# Patient Record
Sex: Female | Born: 1990 | Race: Black or African American | Hispanic: No | Marital: Single | Smoking: Former smoker
Health system: Southern US, Community
[De-identification: ages and names within clinical notes are randomized; demographics above are authoritative.]

## PROBLEM LIST (undated history)

## (undated) DIAGNOSIS — F410 Panic disorder [episodic paroxysmal anxiety] without agoraphobia: Secondary | ICD-10-CM

## (undated) DIAGNOSIS — F419 Anxiety disorder, unspecified: Secondary | ICD-10-CM

---
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
## 2009-03-09 ENCOUNTER — Emergency Department (HOSPITAL_COMMUNITY): Admission: EM | Admit: 2009-03-09 | Discharge: 2009-03-09 | Payer: Self-pay | Admitting: Family Medicine

## 2010-03-22 ENCOUNTER — Emergency Department (HOSPITAL_COMMUNITY)
Admission: EM | Admit: 2010-03-22 | Discharge: 2010-03-22 | Payer: Self-pay | Source: Home / Self Care | Admitting: Family Medicine

## 2010-05-31 LAB — HIV ANTIBODY (ROUTINE TESTING W REFLEX): HIV: NONREACTIVE

## 2010-05-31 LAB — POCT URINALYSIS DIPSTICK
Bilirubin Urine: NEGATIVE
Glucose, UA: NEGATIVE mg/dL
Hgb urine dipstick: NEGATIVE
Ketones, ur: NEGATIVE mg/dL
Nitrite: NEGATIVE
Protein, ur: NEGATIVE mg/dL
Specific Gravity, Urine: 1.015 (ref 1.005–1.030)
Urobilinogen, UA: 0.2 mg/dL (ref 0.0–1.0)
pH: 7.5 (ref 5.0–8.0)

## 2010-05-31 LAB — POCT PREGNANCY, URINE: Preg Test, Ur: NEGATIVE

## 2010-05-31 LAB — GC/CHLAMYDIA PROBE AMP, GENITAL
Chlamydia, DNA Probe: NEGATIVE
GC Probe Amp, Genital: NEGATIVE

## 2010-05-31 LAB — WET PREP, GENITAL
Trich, Wet Prep: NONE SEEN
Yeast Wet Prep HPF POC: NONE SEEN

## 2010-05-31 LAB — HSV 2 ANTIBODY, IGG: HSV 2 Glycoprotein G Ab, IgG: 0.11 IV

## 2010-05-31 LAB — RPR: RPR Ser Ql: NONREACTIVE

## 2010-06-21 LAB — POCT URINALYSIS DIP (DEVICE)
Bilirubin Urine: NEGATIVE
Glucose, UA: NEGATIVE mg/dL
Nitrite: POSITIVE — AB
Protein, ur: 30 mg/dL — AB
Specific Gravity, Urine: 1.02 (ref 1.005–1.030)
Urobilinogen, UA: 1 mg/dL (ref 0.0–1.0)
pH: 6 (ref 5.0–8.0)

## 2010-06-21 LAB — POCT PREGNANCY, URINE: Preg Test, Ur: NEGATIVE

## 2010-06-24 LAB — GC/CHLAMYDIA PROBE AMP, GENITAL
Chlamydia, DNA Probe: NEGATIVE
GC Probe Amp, Genital: NEGATIVE

## 2010-06-24 LAB — POCT URINALYSIS DIP (DEVICE)
Bilirubin Urine: NEGATIVE
Glucose, UA: NEGATIVE mg/dL
Hgb urine dipstick: NEGATIVE
Ketones, ur: NEGATIVE mg/dL
Nitrite: NEGATIVE
Protein, ur: NEGATIVE mg/dL
Specific Gravity, Urine: 1.01 (ref 1.005–1.030)
Urobilinogen, UA: 0.2 mg/dL (ref 0.0–1.0)
pH: 5.5 (ref 5.0–8.0)

## 2010-06-24 LAB — WET PREP, GENITAL
Clue Cells Wet Prep HPF POC: NONE SEEN
Yeast Wet Prep HPF POC: NONE SEEN

## 2010-06-24 LAB — POCT PREGNANCY, URINE: Preg Test, Ur: NEGATIVE

## 2010-06-27 LAB — WET PREP, GENITAL
Clue Cells Wet Prep HPF POC: NONE SEEN
Yeast Wet Prep HPF POC: NONE SEEN

## 2010-06-27 LAB — POCT URINALYSIS DIP (DEVICE)
Glucose, UA: NEGATIVE mg/dL
Ketones, ur: NEGATIVE mg/dL
Nitrite: POSITIVE — AB
Protein, ur: 100 mg/dL — AB
Specific Gravity, Urine: >=1.03 (ref 1.005–1.030)
Urobilinogen, UA: 1 mg/dL (ref 0.0–1.0)
pH: 5.5 (ref 5.0–8.0)

## 2010-06-27 LAB — RPR: RPR Ser Ql: NONREACTIVE

## 2010-06-27 LAB — URINE CULTURE: Colony Count: >=100000

## 2010-06-27 LAB — HIV ANTIBODY (ROUTINE TESTING W REFLEX): HIV: NONREACTIVE

## 2010-06-27 LAB — POCT RAPID STREP A (OFFICE): Streptococcus, Group A Screen (Direct): NEGATIVE

## 2010-06-27 LAB — POCT PREGNANCY, URINE: Preg Test, Ur: NEGATIVE

## 2010-06-27 LAB — GC/CHLAMYDIA PROBE AMP, GENITAL
Chlamydia, DNA Probe: NEGATIVE
GC Probe Amp, Genital: NEGATIVE

## 2010-09-28 ENCOUNTER — Inpatient Hospital Stay (INDEPENDENT_AMBULATORY_CARE_PROVIDER_SITE_OTHER)
Admission: RE | Admit: 2010-09-28 | Discharge: 2010-09-28 | Disposition: A | Discharge status: Home / Self Care | Payer: Self-pay | Source: Ambulatory Visit | Attending: Family Medicine | Admitting: Family Medicine

## 2010-09-28 DIAGNOSIS — J020 Streptococcal pharyngitis: Secondary | ICD-10-CM

## 2010-09-28 LAB — POCT RAPID STREP A: Streptococcus, Group A Screen (Direct): NEGATIVE

## 2012-10-23 ENCOUNTER — Emergency Department (INDEPENDENT_AMBULATORY_CARE_PROVIDER_SITE_OTHER)
Admission: EM | Admit: 2012-10-23 | Discharge: 2012-10-23 | Disposition: A | Discharge status: Home / Self Care | Payer: Self-pay | Source: Home / Self Care

## 2012-10-23 ENCOUNTER — Encounter (HOSPITAL_COMMUNITY): Payer: Self-pay | Admitting: Emergency Medicine

## 2012-10-23 ENCOUNTER — Other Ambulatory Visit (HOSPITAL_COMMUNITY)
Admission: RE | Admit: 2012-10-23 | Discharge: 2012-10-23 | Disposition: A | Discharge status: Home / Self Care | Payer: Self-pay | Source: Ambulatory Visit | Attending: Family Medicine | Admitting: Family Medicine

## 2012-10-23 DIAGNOSIS — Z113 Encounter for screening for infections with a predominantly sexual mode of transmission: Secondary | ICD-10-CM | POA: Insufficient documentation

## 2012-10-23 DIAGNOSIS — N760 Acute vaginitis: Secondary | ICD-10-CM | POA: Insufficient documentation

## 2012-10-23 DIAGNOSIS — N898 Other specified noninflammatory disorders of vagina: Secondary | ICD-10-CM

## 2012-10-23 MED ORDER — METRONIDAZOLE 500 MG PO TABS: 500.0000 mg | ORAL_TABLET | Freq: Two times a day (BID) | ORAL | Status: DC

## 2012-10-23 NOTE — ED Notes (Signed)
C/o vaginal discharge.  Patient states she has been treatment before for BV and it keeps coming back.  White greyish color discharge.  Denies urinating problems

## 2012-10-23 NOTE — ED Provider Notes (Signed)
Angelica Tyler is a 22 y.o. female who presents to Urgent Care today for vaginal discharge present for the last several days consistent with prior episodes of BV. Patient denies any dysuria abdominal pain nausea vomiting or diarrhea and feels well otherwise. No fevers or chills.    PMH reviewed. History of prior BV History  Substance Use Topics  . Smoking status: Not on file  . Smokeless tobacco: Not on file  . Alcohol Use: Not on file   ROS as above Medications reviewed. No current facility-administered medications for this encounter.   Current Outpatient Prescriptions  Medication Sig Dispense Refill  . metroNIDAZOLE (FLAGYL) 500 MG tablet Take 1 tablet (500 mg total) by mouth 2 (two) times daily.  14 tablet  0    Exam:  BP 135/90  Pulse 98  Temp(Src) 99.2 F (37.3 C)  Resp 15  SpO2 100%  LMP 09/13/2012 Gen: Well NAD HEENT: EOMI,  MMM Lungs: CTABL Nl WOB Heart: RRR no MRG Abd: NABS, NT, ND Exts: Non edematous BL  LE, warm and well perfused.  GYN: Normal external genitalia. Vaginal canal with thin grayish discharge. Normal-appearing cervix.  No results found for this or any previous visit (from the past 24 hour(s)). No results found.  Assessment and Plan: 22 y.o. female with vaginal discharge consistent with BV.  Vaginal secretions cytology for gonorrhea, Chlamydia, trichomonas, Gardnerella and yeast.  Treat empirically for BV with metronidazole.  Call patient with result.  Discussed warning signs or symptoms. Please see discharge instructions. Patient expresses understanding.      Rodolph Bong, MD 10/23/12 [phone]

## 2012-10-25 NOTE — ED Notes (Signed)
GC/Chlamydia neg., Affirm: Candida and Trich neg., Gardnerella pos.  Pt. adequately treated with Flagyl. Vassie Moselle 10/25/2012

## 2014-08-05 ENCOUNTER — Emergency Department (INDEPENDENT_AMBULATORY_CARE_PROVIDER_SITE_OTHER)
Admission: EM | Admit: 2014-08-05 | Discharge: 2014-08-05 | Disposition: A | Discharge status: Home / Self Care | Payer: Self-pay | Source: Home / Self Care | Attending: Family Medicine | Admitting: Family Medicine

## 2014-08-05 ENCOUNTER — Encounter (HOSPITAL_COMMUNITY): Payer: Self-pay | Admitting: *Deleted

## 2014-08-05 DIAGNOSIS — F430 Acute stress reaction: Principal | ICD-10-CM

## 2014-08-05 DIAGNOSIS — F410 Panic disorder [episodic paroxysmal anxiety] without agoraphobia: Secondary | ICD-10-CM

## 2014-08-05 NOTE — Discharge Instructions (Signed)
Call [facility] or go to [place] [facility] for further psychological assistance.

## 2014-08-05 NOTE — ED Notes (Signed)
People at work think she is having s panic attacks because she is having dizziness, hands, feet roof of mouth and tongue tingling, shaky, and hard to breathe  sometimes.  Sx.'s last  1 hr., twice a week, since last Wed.  Work told her she needs a work release to come back.

## 2014-08-05 NOTE — ED Provider Notes (Signed)
CSN: [id number]     Arrival date & time 08/05/14  1250 History   First MD Initiated Contact with Patient 08/05/14 1457     Chief Complaint  Patient presents with  . Panic Attack   (Consider location/radiation/quality/duration/timing/severity/associated sxs/prior Treatment) Patient is a 24 y.o. female presenting with mental health disorder. The history is provided by the patient and a parent.  Mental Health Problem Presenting symptoms comment:  No sx at present, feels fine. Patient accompanied by:  Family member Degree of incapacity (severity):  Mild Onset quality:  Gradual Duration:  1 week Progression:  Unchanged Chronicity:  New Context: stressful life event   Context comment:  Just started a new production job at [employer] and has been having episodes c/w panic. Relieved by:  None tried Ineffective treatments:  None tried Associated symptoms: anxiety     History reviewed. No pertinent past medical history. History reviewed. No pertinent past surgical history. History reviewed. No pertinent family history. History  Substance Use Topics  . Smoking status: Current Every Day Smoker  . Smokeless tobacco: Not on file  . Alcohol Use: No   OB History    No data available     Review of Systems  Constitutional: Negative.   HENT: Negative.   Respiratory: Negative.   Cardiovascular: Negative.   Gastrointestinal: Negative.   Neurological: Positive for dizziness, light-headedness and numbness. Negative for tremors and weakness.  Psychiatric/Behavioral: The patient is nervous/anxious.     Allergies  Review of patient's allergies indicates no known allergies.  Home Medications   Prior to Admission medications   Medication Sig Start Date End Date Taking? Authorizing Provider  metroNIDAZOLE (FLAGYL) 500 MG tablet Take 1 tablet (500 mg total) by mouth 2 (two) times daily. 10/23/12   Rodolph BongEvan S Corey, MD   LMP 07/11/2014 Physical Exam  Constitutional: She is oriented to person, place,  and time. She appears well-developed and well-nourished. No distress.  Neck: Normal range of motion. Neck supple.  Cardiovascular: Normal rate, regular rhythm, normal heart sounds and intact distal pulses.   Pulmonary/Chest: Effort normal and breath sounds normal.  Neurological: She is alert and oriented to person, place, and time.  Skin: Skin is warm and dry.  Psychiatric: She has a normal mood and affect. Her behavior is normal. Judgment and thought content normal.  Nursing note and vitals reviewed.   ED Course  Procedures (including critical care time) Labs Review Labs Reviewed - No data to display  Imaging Review No results found.   MDM   1. Panic attack as reaction to stress        Linna HoffJames D Bobbette Eakes, MD 08/05/14 [phone]

## 2014-08-08 ENCOUNTER — Encounter: Payer: Self-pay | Admitting: Emergency Medicine

## 2014-08-08 ENCOUNTER — Emergency Department
Admission: EM | Admit: 2014-08-08 | Discharge: 2014-08-08 | Disposition: A | Discharge status: Home / Self Care | Payer: Self-pay | Attending: Emergency Medicine | Admitting: Emergency Medicine

## 2014-08-08 DIAGNOSIS — F410 Panic disorder [episodic paroxysmal anxiety] without agoraphobia: Secondary | ICD-10-CM

## 2014-08-08 DIAGNOSIS — Z720 Tobacco use: Secondary | ICD-10-CM | POA: Insufficient documentation

## 2014-08-08 DIAGNOSIS — Z79899 Other long term (current) drug therapy: Secondary | ICD-10-CM | POA: Insufficient documentation

## 2014-08-08 DIAGNOSIS — F419 Anxiety disorder, unspecified: Secondary | ICD-10-CM | POA: Insufficient documentation

## 2014-08-08 HISTORY — DX: Anxiety disorder, unspecified: F41.9

## 2014-08-08 HISTORY — DX: Panic disorder (episodic paroxysmal anxiety): F41.0

## 2014-08-08 NOTE — ED Notes (Signed)
Patient states she needs work release note for panic attacks. Informed patient that ER will not give work release notes outside of work note today.

## 2014-08-08 NOTE — ED Provider Notes (Signed)
[facility] Emergency Department Provider Note  ____________________________________________  Time seen: 1211  I have reviewed the triage vital signs and the nursing notes.   HISTORY  Chief Complaint Panic Attack  HPI Damian Leavellyawni M Garczynski is a 24 y.o. female is here today to be cleared for panic attacks and she can go back to work. She was seen in [place] on 5/17 in the emergency room for a panic attack.She was referred from coming to [facility] but mother states that they did not follow up there and patient states she did not get the prescription filled. She denies having any problems now. 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 They will not allow her to come back to work until she has been cleared for panic attacks. She denies any pain at this time.   Past Medical History  Diagnosis Date  . Panic attack   . Anxiety     There are no active problems to display for this patient.   No past surgical history on file.  Current Outpatient Rx  Name  Route  Sig  Dispense  Refill  . metroNIDAZOLE (FLAGYL) 500 MG tablet   Oral   Take 1 tablet (500 mg total) by mouth 2 (two) times daily.   14 tablet   0     Allergies Review of patient's allergies indicates no known allergies.  No family history on file.  Social History History  Substance Use Topics  . Smoking status: Current Every Day Smoker -- 0.00 packs/day    Types: Cigarettes  . Smokeless tobacco: Not on file  . Alcohol Use: No    Review of Systems Constitutional: No fever/chills Eyes: No visual changes. Cardiovascular: Denies chest pain. Respiratory: Denies shortness of breath. Gastrointestinal: No abdominal pain.  No nausea, no vomiting.  No diarrhea.  No constipation. Genitourinary: Negative for dysuria. Musculoskeletal: Negative for back pain. Skin: Negative for rash. Neurological: Negative for headaches, focal weakness or numbness.  10-point ROS  otherwise negative.  ____________________________________________   PHYSICAL EXAM:  VITAL SIGNS: ED Triage Vitals  Enc Vitals Group     BP 08/08/14 1148 139/84 mmHg     Pulse Rate 08/08/14 1148 87     Resp 08/08/14 1148 18     Temp 08/08/14 1148 98.7 F (37.1 C)     Temp Source 08/08/14 1148 Oral     SpO2 08/08/14 1148 100 %     Weight 08/08/14 1148 178 lb (80.74 kg)     Height 08/08/14 1148 5\' 5"  (1.651 m)     Head Cir --      Peak Flow --      Pain Score 08/08/14 1148 0     Pain Loc --      Pain Edu? --      Excl. in GC? --     Constitutional: Alert and oriented. Well appearing and in no acute distress. Eyes: Conjunctivae are normal. PERRL. EOMI. Head: Atraumatic. Nose: No congestion/rhinnorhea. Mouth/Throat: Mucous membranes are moist.  Neck: No stridor.   Cardiovascular: Normal rate, regular rhythm. Grossly normal heart sounds.  Good peripheral circulation. Respiratory: Normal respiratory effort.  No retractions. Lungs CTAB. Gastrointestinal: Soft and nontender. No distention. Musculoskeletal: No lower extremity tenderness nor edema.  No joint effusions. Neurologic:  Normal speech and language. No gross focal neurologic deficits are appreciated. Speech is normal. No gait instability. Skin:  Skin is warm, dry and intact. No rash noted. Psychiatric: Mood and affect are  normal. Speech and behavior are normal.  ____________________________________________   LABS (all labs ordered are listed, but only abnormal results are displayed)  Labs Reviewed - No data to display ____________________________________________  EKG  Deferred ____________________________________________  RADIOLOGY  Deferred ____________________________________________   PROCEDURES  Procedure(s) performed: None  Critical Care performed: No  ____________________________________________   INITIAL IMPRESSION / ASSESSMENT AND PLAN / ED COURSE  Pertinent labs & imaging results that  were available during my care of the patient were reviewed by me and considered in my medical decision making (see chart for details).  Explained to patient that we could give her noticing she was seen here in the emergency room today. However we cannot certify that she is mentally able to go back to work without restrictions. She was given information about advanced access or to continue to follow-up with [facility]. ____________________________________________   FINAL CLINICAL IMPRESSION(S) / ED DIAGNOSES  Final diagnoses:  Anxiety attack      Tommi RumpsRhonda L Summers, PA-C 08/08/14 1319  Governor Rooksebecca Lord, MD 08/08/14 [phone]

## 2014-08-08 NOTE — Discharge Instructions (Signed)
Panic Attacks °Panic attacks are sudden, short feelings of great fear or discomfort. You may have them for no reason when you are relaxed, when you are uneasy (anxious), or when you are sleeping.  °HOME CARE °· Take all your medicines as told. °· Check with your doctor before starting new medicines. °· Keep all doctor visits. °GET HELP IF: °· You are not able to take your medicines as told. °· Your symptoms do not get better. °· Your symptoms get worse. °GET HELP RIGHT AWAY IF: °· Your attacks seem different than your normal attacks. °· You have thoughts about hurting yourself or others. °· You take panic attack medicine and you have a side effect. °MAKE SURE YOU: °· Understand these instructions. °· Will watch your condition. °· Will get help right away if you are not doing well or get worse. °Document Released: 04/09/2010 Document Revised: 12/26/2012 Document Reviewed: 10/19/2012 °ExitCare® Patient Information ©2015 [employer]. This information is not intended to replace advice given to you by your health care provider. Make sure you discuss any questions you have with your health care provider. ° °

## 2018-11-06 ENCOUNTER — Ambulatory Visit: Payer: Self-pay | Admitting: Physician Assistant

## 2018-11-06 ENCOUNTER — Other Ambulatory Visit: Payer: Self-pay

## 2018-11-06 ENCOUNTER — Encounter: Payer: Self-pay | Admitting: Physician Assistant

## 2018-11-06 DIAGNOSIS — Z113 Encounter for screening for infections with a predominantly sexual mode of transmission: Secondary | ICD-10-CM

## 2018-11-06 DIAGNOSIS — Z202 Contact with and (suspected) exposure to infections with a predominantly sexual mode of transmission: Secondary | ICD-10-CM

## 2018-11-06 LAB — WET PREP FOR TRICH, YEAST, CLUE
Trichomonas Exam: NEGATIVE
Yeast Exam: NEGATIVE

## 2018-11-06 MED ORDER — PENICILLIN G BENZATHINE 1200000 UNIT/2ML IM SUSP
2.4000 10*6.[IU] | Freq: Once | INTRAMUSCULAR | Status: AC
  Administered 2018-11-06: 11:00:00 2.4 10*6.[IU] via INTRAMUSCULAR

## 2018-11-06 NOTE — Progress Notes (Signed)
STI clinic/screening visit  Subjective:  Angelica Tyler is a 28 y.o. female being seen today for an STI screening visit. The patient reports they do not have symptoms.  Patient has the following medical conditions:  There are no active problems to display for this patient.    Chief Complaint  Patient presents with  . SEXUALLY TRANSMITTED DISEASE    HPI  Patient reports that she is a contact to Syphilis.  Denies any symptoms today.  Reports that she spoke with someone from [facility] but does not remember whether they said she needs treatment or not.  States that she has been taking Amoxicillin for the last few days for possible strep throat and has one more dose today and then 2 doses tomorrow to finish the medication.   See flowsheet for further details and programmatic requirements.    The following portions of the patient's history were reviewed and updated as appropriate: allergies, current medications, past medical history, past social history, past surgical history and problem list.  Objective:  There were no vitals filed for this visit.  Physical Exam Constitutional:      General: She is not in acute distress.    Appearance: Normal appearance.  HENT:     Head: Normocephalic and atraumatic.     Mouth/Throat:     Mouth: Mucous membranes are moist.     Pharynx: Oropharynx is clear. No oropharyngeal exudate or posterior oropharyngeal erythema.  Eyes:     Conjunctiva/sclera: Conjunctivae normal.  Neck:     Musculoskeletal: Neck supple.  Pulmonary:     Effort: Pulmonary effort is normal.  Abdominal:     Palpations: Abdomen is soft. There is no mass.     Tenderness: There is no abdominal tenderness. There is no guarding or rebound.  Genitourinary:    General: Normal vulva.     Rectum: Normal.     Comments: External genitalia/pubic area without nits, lice, edema, erythema or inguinal adenopathy. Vagina with normal mucosa and discharge. Cervix without visible  lesions Uterus normal size, firm, mobile, nt, no masses, no CMT, no adnexal tenderness or fullness.  Lymphadenopathy:     Cervical: No cervical adenopathy.  Skin:    General: Skin is warm and dry.     Findings: No bruising, erythema, lesion or rash.  Neurological:     Mental Status: She is alert and oriented to person, place, and time.  Psychiatric:        Mood and Affect: Mood normal.        Behavior: Behavior normal.        Thought Content: Thought content normal.        Judgment: Judgment normal.       Assessment and Plan:  Angelica Leavellyawni M Courts is a 28 y.o. female presenting [facility] for STI screening  1. Screening for STD (sexually transmitted disease) Patient into clinic as a contact and is without any symptoms. Rec condoms with all sex Await test results.  Counseled that RN will call if needs to RTC for any further treatment. Counseled patient that due to current antibiotic treatment her results will not be as accurate. - WET PREP FOR TRICH, YEAST, CLUE - Chlamydia/Gonorrhea [place] [facility] - HIV [place] [facility] - Syphilis Serology, [place] [facility]  2. Syphilis contact Per [facility], patient needs to be tested and treated today. Patient agrees to treatment today.  Bicillin 2.564mu IM given today. No sex for 14 days and until after  partner completes treatment. RTC in 6 months, 12 months, and then annually for titer check if RPR is positive. - penicillin g benzathine (BICILLIN LA) 1200000 UNIT/2ML injection 2.4 Million Units     No follow-ups on file.  No future appointments.  Jerene Dilling, PA

## 2018-11-12 ENCOUNTER — Telehealth: Payer: Self-pay | Admitting: Family Medicine

## 2018-11-14 NOTE — Telephone Encounter (Signed)
Returned call-discussed 11/06/18 results not in yet; reports has began having mild vaginal itching-offered office visit vs OTC med and stated will try Monistat; reminded if +RPR will need f/u test in 6 months-agreed. No further ?'s Debera Lat, RN

## 2018-11-19 ENCOUNTER — Telehealth: Payer: Self-pay | Admitting: General Practice

## 2018-11-19 NOTE — Telephone Encounter (Signed)
QUESTIONS PERTAINING TO PAST STD TREATMENT

## 2018-11-20 NOTE — Telephone Encounter (Signed)
Phone call to pt, answered some generic questions about syphilis treatments and follow-ups. Pt understands that her and her partner must follow the specific advice given by provider for their specific situation and course of treatment.

## 2018-12-08 ENCOUNTER — Other Ambulatory Visit: Payer: Self-pay

## 2018-12-08 ENCOUNTER — Emergency Department
Admission: EM | Admit: 2018-12-08 | Discharge: 2018-12-08 | Disposition: A | Discharge status: Home / Self Care | Payer: Self-pay | Attending: Emergency Medicine | Admitting: Emergency Medicine

## 2018-12-08 ENCOUNTER — Encounter: Payer: Self-pay | Admitting: Emergency Medicine

## 2018-12-08 DIAGNOSIS — N390 Urinary tract infection, site not specified: Secondary | ICD-10-CM | POA: Insufficient documentation

## 2018-12-08 DIAGNOSIS — F17210 Nicotine dependence, cigarettes, uncomplicated: Secondary | ICD-10-CM | POA: Insufficient documentation

## 2018-12-08 LAB — URINALYSIS, COMPLETE (UACMP) WITH MICROSCOPIC
Bacteria, UA: NONE SEEN
Bilirubin Urine: NEGATIVE
Glucose, UA: NEGATIVE mg/dL
Ketones, ur: NEGATIVE mg/dL
Nitrite: NEGATIVE
Protein, ur: 100 mg/dL — AB
RBC / HPF: >50 RBC/hpf — ABNORMAL HIGH (ref 0–5)
Specific Gravity, Urine: 1.019 (ref 1.005–1.030)
Squamous Epithelial / LPF: NONE SEEN (ref 0–5)
WBC, UA: >50 WBC/hpf — ABNORMAL HIGH (ref 0–5)
pH: 5 (ref 5.0–8.0)

## 2018-12-08 LAB — PREGNANCY, URINE: Preg Test, Ur: NEGATIVE

## 2018-12-08 MED ORDER — PHENAZOPYRIDINE HCL 200 MG PO TABS: 200.0000 mg | ORAL_TABLET | Freq: Three times a day (TID) | ORAL | 0 refills | Status: AC | PRN

## 2018-12-08 MED ORDER — PHENAZOPYRIDINE HCL 200 MG PO TABS
200.0000 mg | ORAL_TABLET | Freq: Once | ORAL | Status: AC
  Administered 2018-12-08: 20:00:00 200 mg via ORAL
  Filled 2018-12-08: qty 1

## 2018-12-08 MED ORDER — FLUCONAZOLE 150 MG PO TABS: 150.0000 mg | ORAL_TABLET | Freq: Every day | ORAL | 1 refills | Status: DC

## 2018-12-08 MED ORDER — SULFAMETHOXAZOLE-TRIMETHOPRIM 800-160 MG PO TABS: 1.0000 | ORAL_TABLET | Freq: Two times a day (BID) | ORAL | 0 refills | Status: DC

## 2018-12-08 MED ORDER — SULFAMETHOXAZOLE-TRIMETHOPRIM 800-160 MG PO TABS
1.0000 | ORAL_TABLET | Freq: Once | ORAL | Status: AC
  Administered 2018-12-08: 1 via ORAL
  Filled 2018-12-08: qty 1

## 2018-12-08 NOTE — ED Triage Notes (Signed)
Burning and frequency of urination x 1 week.

## 2018-12-08 NOTE — ED Provider Notes (Signed)
[facility] Emergency Department Provider Note       Time seen: ----------------------------------------- 7:34 PM on 12/08/2018 -----------------------------------------   I have reviewed the triage vital signs and the nursing notes.  HISTORY   Chief Complaint Dysuria    HPI Angelica Tyler is a 28 y.o. female with a history of anxiety and panic attacks who presents to the ED for dysuria.  Patient describes burning and frequency of urination over the past week.  Has had UTIs in the past and this feels similarly.  She is not concerned about STI or pregnancy.  Past Medical History:  Diagnosis Date  . Anxiety   . Panic attack     There are no active problems to display for this patient.   History reviewed. No pertinent surgical history.  Allergies Patient has no known allergies.  Social History Social History   Tobacco Use  . Smoking status: Current Every Day Smoker    Packs/day: 0.00    Types: Cigarettes  . Smokeless tobacco: Never Used  Substance Use Topics  . Alcohol use: No  . Drug use: Yes    Types: Marijuana   Review of Systems Constitutional: Negative for fever. Cardiovascular: Negative for chest pain. Respiratory: Negative for shortness of breath. Gastrointestinal: Negative for abdominal pain, vomiting and diarrhea. Genitourinary: Positive for dysuria Musculoskeletal: Negative for back pain. Skin: Negative for rash. Neurological: Negative for headaches, focal weakness or numbness.  All systems negative/normal/unremarkable except as stated in the HPI  ____________________________________________   PHYSICAL EXAM:  VITAL SIGNS: ED Triage Vitals  Enc Vitals Group     BP 12/08/18 1835 (!) 105/55     Pulse Rate 12/08/18 1835 86     Resp 12/08/18 1835 18     Temp 12/08/18 1835 98.5 F (36.9 C)     Temp Source 12/08/18 1835 Oral     SpO2 12/08/18 1835 100 %     Weight 12/08/18 1836 135 lb (61.2 kg)     Height 12/08/18  1836 5\' 5"  (1.651 m)     Head Circumference --      Peak Flow --      Pain Score 12/08/18 1836 10     Pain Loc --      Pain Edu? --      Excl. in GC? --    Constitutional: Alert and oriented. Well appearing and in no distress. Cardiovascular: Normal rate, regular rhythm. No murmurs, rubs, or gallops. Respiratory: Normal respiratory effort without tachypnea nor retractions. Breath sounds are clear and equal bilaterally. No wheezes/rales/rhonchi. Gastrointestinal: Soft and nontender. Normal bowel sounds Musculoskeletal: Nontender with normal range of motion in extremities. No lower extremity tenderness nor edema. Neurologic:  Normal speech and language. No gross focal neurologic deficits are appreciated.  Skin:  Skin is warm, dry and intact. No rash noted. Psychiatric: Mood and affect are normal. Speech and behavior are normal.  ___________________________________________  ED COURSE:  As part of my medical decision making, I reviewed the following data within the electronic MEDICAL RECORD NUMBER History obtained from family if available, nursing notes, old chart and ekg, as well as notes from prior ED visits. Patient presented for symptoms of UTI, we will assess with labs and imaging as indicated at this time.   Procedures  Angelica Angelica M Sharpless was evaluated in Emergency Department on 12/08/2018 for the symptoms described in the history of present illness. She was evaluated in the context of the global COVID-19 pandemic, which necessitated consideration that the patient  might be at risk for infection with the SARS-CoV-2 virus that causes COVID-19. Institutional protocols and algorithms that pertain to the evaluation of patients at risk for COVID-19 are in a state of rapid change based on information released by regulatory bodies including the CDC and federal and state organizations. These policies and algorithms were followed during the patient's care in the ED.   ____________________________________________   LABS (pertinent positives/negatives)  Labs Reviewed  URINALYSIS, COMPLETE (UACMP) WITH MICROSCOPIC - Abnormal; Notable for the following components:      Result Value   Color, Urine YELLOW (*)    APPearance TURBID (*)    Hgb urine dipstick SMALL (*)    Protein, ur 100 (*)    Leukocytes,Ua LARGE (*)    RBC / HPF >50 (*)    WBC, UA >50 (*)    All other components within normal limits  PREGNANCY, URINE   ___________________________________________   DIFFERENTIAL DIAGNOSIS   Cystitis, STI, pregnancy  FINAL ASSESSMENT AND PLAN  UTI   Plan: The patient had presented for dysuria. Patient's labs were indicative of a UTI.  She was started on Pyridium and Bactrim DS.  She is cleared for outpatient follow-up as needed.   Laurence Aly, MD    Note: This note was generated in part or whole with voice recognition software. Voice recognition is usually quite accurate but there are transcription errors that can and very often do occur. I apologize for any typographical errors that were not detected and corrected.     Earleen Newport, MD 12/08/18 Joen Laura

## 2019-02-20 ENCOUNTER — Other Ambulatory Visit: Payer: Self-pay | Admitting: Family Medicine

## 2019-02-20 DIAGNOSIS — Z3201 Encounter for pregnancy test, result positive: Secondary | ICD-10-CM

## 2019-03-12 ENCOUNTER — Ambulatory Visit: Payer: Medicaid Other

## 2019-03-18 ENCOUNTER — Ambulatory Visit
Admission: RE | Admit: 2019-03-18 | Discharge: 2019-03-18 | Disposition: A | Discharge status: Home / Self Care | Payer: Medicaid Other | Source: Ambulatory Visit | Attending: Family Medicine | Admitting: Family Medicine

## 2019-03-18 ENCOUNTER — Other Ambulatory Visit: Payer: Self-pay | Admitting: Family Medicine

## 2019-03-18 ENCOUNTER — Other Ambulatory Visit: Payer: Self-pay

## 2019-03-18 DIAGNOSIS — Z3201 Encounter for pregnancy test, result positive: Secondary | ICD-10-CM | POA: Diagnosis not present

## 2019-03-20 ENCOUNTER — Ambulatory Visit: Payer: Self-pay

## 2019-03-20 LAB — OB RESULTS CONSOLE GC/CHLAMYDIA
Chlamydia: NEGATIVE
Gonorrhea: NEGATIVE

## 2019-03-21 LAB — OB RESULTS CONSOLE ANTIBODY SCREEN: Antibody Screen: NEGATIVE

## 2019-03-21 LAB — OB RESULTS CONSOLE RUBELLA ANTIBODY, IGM: Rubella: IMMUNE

## 2019-03-21 LAB — OB RESULTS CONSOLE RPR: RPR: NONREACTIVE

## 2019-03-21 LAB — OB RESULTS CONSOLE ABO/RH: RH Type: POSITIVE

## 2019-03-21 LAB — OB RESULTS CONSOLE VARICELLA ZOSTER ANTIBODY, IGG: Varicella: NON-IMMUNE/NOT IMMUNE

## 2019-03-21 LAB — OB RESULTS CONSOLE HEPATITIS B SURFACE ANTIGEN: Hepatitis B Surface Ag: NEGATIVE

## 2019-03-21 LAB — OB RESULTS CONSOLE HIV ANTIBODY (ROUTINE TESTING): HIV: NONREACTIVE

## 2019-03-22 NOTE — L&D Delivery Note (Signed)
Delivery Note  Date of delivery: 10/10/2019 Estimated Date of Delivery: 10/03/19 Patient's last menstrual period was 12/27/2018. EGA: [redacted]w[redacted]d  First Stage: Labor onset: 0900 10/10/2019 Induction/Augmentation : misoprostol, foley balloon, AROM, pitocin Analgesia /Anesthesia intrapartum: epidural AROM at 1705 for clear fluid  Angelica Tyler presented to L&D with planned induction of labor for late term pregnancy. She was induced with one dose of misoprostol. Foley balloon later place. AROM and pitocin augmentation after expulsion of foley balloon. Epidural placed for pain relief.   Second Stage: Complete dilation at 2114 Onset of pushing at 2127 FHR second stage: category II Delivery at 2202 on 10/10/2019  She progressed to complete and had a spontaneous vaginal birth of a live female, pushing over an intact perineum. The fetal head was delivered in direct OA position with restitution to ROA. No nuchal cord. Anterior then posterior shoulders delivered with minimal assistance. Baby placed on mom's abdomen and attended to by transition RN. Cord clamped and cut when pulseless by father of the baby.   Third Stage: Placenta delivered spontaneously intact with 3VC at 2207 Placenta disposition: routine disposal Uterine tone firm with massage / bleeding initially brisk then slowed with uterine massage IV pitocin and rectal misoprostol  given for hemorrhage prophylaxis  Right labial and 2nd degree left vaginal wall laceration identified  Anesthesia for repair: epidural Repair 3-0 Vicryl Rapide CT, 3-0 Vicryl SH Quant. Blood Loss (mL): 672  Complications: none  Mom to postpartum.  Baby to Couplet care / Skin to Skin.  Newborn: Birth Weight: pending  Apgar Scores: 9, 9 Feeding planned: breast   Angelica Tyler, CNM 10/10/2019 10:43 PM

## 2019-04-29 ENCOUNTER — Other Ambulatory Visit: Payer: Self-pay | Admitting: Family Medicine

## 2019-04-29 DIAGNOSIS — Z3402 Encounter for supervision of normal first pregnancy, second trimester: Secondary | ICD-10-CM

## 2019-05-13 ENCOUNTER — Ambulatory Visit
Admission: RE | Admit: 2019-05-13 | Discharge: 2019-05-13 | Disposition: A | Discharge status: Home / Self Care | Payer: Medicaid Other | Source: Ambulatory Visit | Attending: Family Medicine | Admitting: Family Medicine

## 2019-05-13 ENCOUNTER — Other Ambulatory Visit: Payer: Self-pay

## 2019-05-13 DIAGNOSIS — Z3402 Encounter for supervision of normal first pregnancy, second trimester: Secondary | ICD-10-CM | POA: Diagnosis present

## 2019-08-27 ENCOUNTER — Other Ambulatory Visit: Payer: Self-pay | Admitting: Family Medicine

## 2019-08-27 DIAGNOSIS — O26843 Uterine size-date discrepancy, third trimester: Secondary | ICD-10-CM

## 2019-09-03 ENCOUNTER — Other Ambulatory Visit: Payer: Self-pay

## 2019-09-03 ENCOUNTER — Ambulatory Visit
Admission: RE | Admit: 2019-09-03 | Discharge: 2019-09-03 | Disposition: A | Discharge status: Home / Self Care | Payer: Medicaid Other | Source: Ambulatory Visit | Attending: Family Medicine | Admitting: Family Medicine

## 2019-09-03 DIAGNOSIS — O26843 Uterine size-date discrepancy, third trimester: Secondary | ICD-10-CM | POA: Insufficient documentation

## 2019-09-03 LAB — OB RESULTS CONSOLE GC/CHLAMYDIA
Chlamydia: NEGATIVE
Gonorrhea: NEGATIVE

## 2019-10-06 ENCOUNTER — Observation Stay
Admission: EM | Admit: 2019-10-06 | Discharge: 2019-10-06 | Disposition: A | Discharge status: Home / Self Care | Payer: Medicaid Other | Attending: Obstetrics and Gynecology | Admitting: Obstetrics and Gynecology

## 2019-10-06 ENCOUNTER — Other Ambulatory Visit: Payer: Self-pay

## 2019-10-06 DIAGNOSIS — O471 False labor at or after 37 completed weeks of gestation: Secondary | ICD-10-CM | POA: Diagnosis not present

## 2019-10-06 DIAGNOSIS — O479 False labor, unspecified: Secondary | ICD-10-CM | POA: Diagnosis present

## 2019-10-06 DIAGNOSIS — Z3A41 41 weeks gestation of pregnancy: Secondary | ICD-10-CM | POA: Diagnosis not present

## 2019-10-06 MED ORDER — DOCUSATE SODIUM 100 MG PO CAPS: 100.0000 mg | ORAL_CAPSULE | Freq: Every day | ORAL | Status: DC

## 2019-10-06 NOTE — Discharge Summary (Signed)
Patient here for inconsistent painful contractions that do not have a pattern. She is asking about a possible induction date. Denies bleeding of LOF   Patient ID: Angelica Tyler MRN: [id number] DOB/AGE: 1991/03/10 29 y.o.  Admit date: 10/06/2019 Discharge date: 10/06/2019  Admission Diagnoses: Inconsistent painful contractions   Discharge Diagnoses: Reactive NST  Prenatal Procedures: NST  Consults: None  Significant Diagnostic Studies:  No results found for this or any previous visit (from the past 168 hour(s)).  Treatments: none  Hospital Course:  This is a 29 y.o. G2P0010 with IUP at [redacted]w[redacted]d observed for labor, noted to have a cervical exam of 0.5/long/-2.  No leaking of fluid and no bleeding.  She was observed, fetal heart rate monitoring remained reassuring, and she had no signs/symptoms of progressing labor or other maternal-fetal concerns.  Her cervical exam was unchanged from admission.  She was deemed stable for discharge to home with outpatient follow up.  Discharge Physical Exam:  BP 123/75   Pulse (!) 110   Temp 98.1 F (36.7 C) (Oral)   Resp (!) 22   LMP 12/27/2018   SpO2 98%   General: NAD CV: RRR Pulm: nl effort ABD: s/nd/nt, gravid DVT Evaluation: LE non-ttp, no evidence of DVT on exam.  NST: FHR baseline: 135 bpm Variability: moderate Accelerations: yes Decelerations: none Time: 90 minutes Category/reactivity: reactive   TOCO: occasional  SVE:  Dilation: 1 Effacement (%): 40, 50 Station: Ballotable Exam by:: LSE Rn    Discharge Condition: Stable  Disposition: Discharge disposition: 01-Home or Self Care        Allergies as of 10/06/2019   No Known Allergies     Medication List    STOP taking these medications   fluconazole 150 MG tablet Commonly known as: Diflucan   sulfamethoxazole-trimethoprim 800-160 MG tablet Commonly known as: Bactrim DS     TAKE these medications   prenatal multivitamin Tabs tablet Take 1 tablet by  mouth daily at 12 noon.       Follow-up Information    [facility]: [facility] Rhonda W .   Specialty: Behavioral Health Contact information: [address] [facility] [place] [address] [phone]       Reserve: [address] [place] .               SignedHaroldine Laws, CNM 10/06/2019 10:10 PM

## 2019-10-06 NOTE — OB Triage Note (Signed)
Patient here for inconsistent painful contractions that do not have a pattern. She is asking about a possible induction date. Denies bleeding of LOF

## 2019-10-06 NOTE — Progress Notes (Signed)
Patient discharged home with instructions on when to return for IOL and covid testing. Verbalizes understanding.

## 2019-10-08 ENCOUNTER — Other Ambulatory Visit: Payer: Self-pay

## 2019-10-08 ENCOUNTER — Other Ambulatory Visit
Admission: RE | Admit: 2019-10-08 | Discharge: 2019-10-08 | Disposition: A | Discharge status: Home / Self Care | Payer: Medicaid Other | Source: Ambulatory Visit | Attending: Obstetrics and Gynecology | Admitting: Obstetrics and Gynecology

## 2019-10-08 DIAGNOSIS — Z20822 Contact with and (suspected) exposure to covid-19: Secondary | ICD-10-CM | POA: Insufficient documentation

## 2019-10-08 DIAGNOSIS — Z01812 Encounter for preprocedural laboratory examination: Secondary | ICD-10-CM | POA: Diagnosis present

## 2019-10-09 LAB — SARS CORONAVIRUS 2 (TAT 6-24 HRS): SARS Coronavirus 2: NEGATIVE

## 2019-10-10 ENCOUNTER — Inpatient Hospital Stay
Admission: RE | Admit: 2019-10-10 | Discharge: 2019-10-12 | DRG: 807 | Disposition: A | Discharge status: Home / Self Care | Payer: Medicaid Other | Attending: Certified Nurse Midwife | Admitting: Certified Nurse Midwife

## 2019-10-10 ENCOUNTER — Inpatient Hospital Stay: Payer: Medicaid Other | Admitting: Anesthesiology

## 2019-10-10 ENCOUNTER — Encounter: Payer: Self-pay | Admitting: Obstetrics and Gynecology

## 2019-10-10 ENCOUNTER — Other Ambulatory Visit: Payer: Self-pay | Admitting: Certified Nurse Midwife

## 2019-10-10 ENCOUNTER — Other Ambulatory Visit: Payer: Self-pay

## 2019-10-10 DIAGNOSIS — D509 Iron deficiency anemia, unspecified: Secondary | ICD-10-CM | POA: Diagnosis present

## 2019-10-10 DIAGNOSIS — O9902 Anemia complicating childbirth: Secondary | ICD-10-CM | POA: Diagnosis present

## 2019-10-10 DIAGNOSIS — Z3A41 41 weeks gestation of pregnancy: Secondary | ICD-10-CM

## 2019-10-10 DIAGNOSIS — Z87891 Personal history of nicotine dependence: Secondary | ICD-10-CM | POA: Diagnosis not present

## 2019-10-10 DIAGNOSIS — O480 Post-term pregnancy: Secondary | ICD-10-CM | POA: Diagnosis present

## 2019-10-10 LAB — COMPREHENSIVE METABOLIC PANEL
ALT: 12 U/L (ref 0–44)
AST: 19 U/L (ref 15–41)
Albumin: 3.1 g/dL — ABNORMAL LOW (ref 3.5–5.0)
Alkaline Phosphatase: 283 U/L — ABNORMAL HIGH (ref 38–126)
Anion gap: 12 (ref 5–15)
BUN: 5 mg/dL — ABNORMAL LOW (ref 6–20)
CO2: 19 mmol/L — ABNORMAL LOW (ref 22–32)
Calcium: 9.3 mg/dL (ref 8.9–10.3)
Chloride: 105 mmol/L (ref 98–111)
Creatinine, Ser: 0.69 mg/dL (ref 0.44–1.00)
GFR calc Af Amer: >60 mL/min (ref >60)
GFR calc non Af Amer: >60 mL/min (ref >60)
Glucose, Bld: 105 mg/dL — ABNORMAL HIGH (ref 70–99)
Potassium: 4.1 mmol/L (ref 3.5–5.1)
Sodium: 136 mmol/L (ref 135–145)
Total Bilirubin: 1.2 mg/dL (ref 0.3–1.2)
Total Protein: 7.1 g/dL (ref 6.5–8.1)

## 2019-10-10 LAB — PROTEIN / CREATININE RATIO, URINE
Creatinine, Urine: 55 mg/dL
Protein Creatinine Ratio: 0.2 mg/mg{Cre} — ABNORMAL HIGH (ref 0.00–0.15)
Total Protein, Urine: 11 mg/dL

## 2019-10-10 LAB — URINE DRUG SCREEN, QUALITATIVE (ARMC ONLY)
Amphetamines, Ur Screen: NOT DETECTED
Barbiturates, Ur Screen: NOT DETECTED
Benzodiazepine, Ur Scrn: NOT DETECTED
Cannabinoid 50 Ng, Ur ~~LOC~~: NOT DETECTED
Cocaine Metabolite,Ur ~~LOC~~: NOT DETECTED
MDMA (Ecstasy)Ur Screen: NOT DETECTED
Methadone Scn, Ur: NOT DETECTED
Opiate, Ur Screen: NOT DETECTED
Phencyclidine (PCP) Ur S: NOT DETECTED
Tricyclic, Ur Screen: NOT DETECTED

## 2019-10-10 LAB — CBC
HCT: 38.2 % (ref 36.0–46.0)
Hemoglobin: 12.7 g/dL (ref 12.0–15.0)
MCH: 25.2 pg — ABNORMAL LOW (ref 26.0–34.0)
MCHC: 33.2 g/dL (ref 30.0–36.0)
MCV: 75.9 fL — ABNORMAL LOW (ref 80.0–100.0)
Platelets: 269 10*3/uL (ref 150–400)
RBC: 5.03 MIL/uL (ref 3.87–5.11)
RDW: 20.7 % — ABNORMAL HIGH (ref 11.5–15.5)
WBC: 9.5 10*3/uL (ref 4.0–10.5)
nRBC: 0 % (ref 0.0–0.2)

## 2019-10-10 LAB — ABO/RH: ABO/RH(D): A POS

## 2019-10-10 LAB — TYPE AND SCREEN
ABO/RH(D): A POS
Antibody Screen: NEGATIVE

## 2019-10-10 MED ORDER — OXYTOCIN-SODIUM CHLORIDE 30-0.9 UT/500ML-% IV SOLN
1.0000 m[IU]/min | INTRAVENOUS | Status: DC
  Administered 2019-10-10 (×2): 2 m[IU]/min via INTRAVENOUS
  Filled 2019-10-10: qty 500

## 2019-10-10 MED ORDER — MISOPROSTOL 200 MCG PO TABS
ORAL_TABLET | ORAL | Status: AC
  Administered 2019-10-10: 800 ug via RECTAL
  Filled 2019-10-10: qty 1

## 2019-10-10 MED ORDER — AMMONIA AROMATIC IN INHA
RESPIRATORY_TRACT | Status: AC
  Filled 2019-10-10: qty 10

## 2019-10-10 MED ORDER — FENTANYL 2.5 MCG/ML W/ROPIVACAINE 0.15% IN NS 100 ML EPIDURAL (ARMC)
EPIDURAL | Status: AC
  Filled 2019-10-10: qty 100

## 2019-10-10 MED ORDER — OXYTOCIN-SODIUM CHLORIDE 30-0.9 UT/500ML-% IV SOLN
2.5000 [IU]/h | INTRAVENOUS | Status: DC
  Administered 2019-10-11: 2.5 [IU]/h via INTRAVENOUS
  Filled 2019-10-10 (×2): qty 500

## 2019-10-10 MED ORDER — ACETAMINOPHEN 325 MG PO TABS
650.0000 mg | ORAL_TABLET | ORAL | Status: DC | PRN
  Administered 2019-10-11 – 2019-10-12 (×3): 650 mg via ORAL
  Filled 2019-10-10 (×3): qty 2

## 2019-10-10 MED ORDER — MISOPROSTOL 25 MCG QUARTER TABLET
25.0000 ug | ORAL_TABLET | ORAL | Status: DC | PRN
  Administered 2019-10-10: 25 ug via BUCCAL
  Filled 2019-10-10 (×2): qty 1

## 2019-10-10 MED ORDER — LACTATED RINGERS IV SOLN: 500.0000 mL | INTRAVENOUS | Status: DC | PRN

## 2019-10-10 MED ORDER — MISOPROSTOL 25 MCG QUARTER TABLET
25.0000 ug | ORAL_TABLET | ORAL | Status: DC | PRN
  Filled 2019-10-10 (×2): qty 1

## 2019-10-10 MED ORDER — MISOPROSTOL 200 MCG PO TABS
ORAL_TABLET | ORAL | Status: AC
  Administered 2019-10-10: 25 ug via VAGINAL
  Filled 2019-10-10: qty 4

## 2019-10-10 MED ORDER — OXYTOCIN BOLUS FROM INFUSION
333.0000 mL | Freq: Once | INTRAVENOUS | Status: AC
  Administered 2019-10-10: 333 mL via INTRAVENOUS

## 2019-10-10 MED ORDER — BUTORPHANOL TARTRATE 1 MG/ML IJ SOLN: 1.0000 mg | INTRAMUSCULAR | Status: DC | PRN

## 2019-10-10 MED ORDER — BENZOCAINE-MENTHOL 20-0.5 % EX AERO
1.0000 "application " | INHALATION_SPRAY | CUTANEOUS | Status: DC | PRN
  Filled 2019-10-10 (×2): qty 56

## 2019-10-10 MED ORDER — BUPIVACAINE HCL (PF) 0.25 % IJ SOLN
INTRAMUSCULAR | Status: DC | PRN
  Administered 2019-10-10 (×2): 5 mL via EPIDURAL

## 2019-10-10 MED ORDER — ACETAMINOPHEN 325 MG PO TABS: 650.0000 mg | ORAL_TABLET | ORAL | Status: DC | PRN

## 2019-10-10 MED ORDER — LACTATED RINGERS IV SOLN: INTRAVENOUS | Status: DC

## 2019-10-10 MED ORDER — LIDOCAINE HCL (PF) 1 % IJ SOLN
30.0000 mL | INTRAMUSCULAR | Status: DC | PRN
  Filled 2019-10-10: qty 30

## 2019-10-10 MED ORDER — MISOPROSTOL 200 MCG PO TABS: 800.0000 ug | ORAL_TABLET | Freq: Once | ORAL | Status: DC

## 2019-10-10 MED ORDER — IBUPROFEN 600 MG PO TABS
600.0000 mg | ORAL_TABLET | Freq: Four times a day (QID) | ORAL | Status: DC
  Administered 2019-10-11: 600 mg via ORAL
  Filled 2019-10-10: qty 1

## 2019-10-10 MED ORDER — LIDOCAINE-EPINEPHRINE (PF) 1.5 %-1:200000 IJ SOLN
INTRAMUSCULAR | Status: DC | PRN
  Administered 2019-10-10: 3 mL via PERINEURAL

## 2019-10-10 MED ORDER — TERBUTALINE SULFATE 1 MG/ML IJ SOLN: 0.2500 mg | Freq: Once | INTRAMUSCULAR | Status: DC | PRN

## 2019-10-10 MED ORDER — FENTANYL 2.5 MCG/ML W/ROPIVACAINE 0.15% IN NS 100 ML EPIDURAL (ARMC)
EPIDURAL | Status: DC | PRN
  Administered 2019-10-10: 12 mL/h via EPIDURAL

## 2019-10-10 MED ORDER — VARICELLA VIRUS VACCINE LIVE 1350 PFU/0.5ML IJ SUSR
0.5000 mL | Freq: Once | INTRAMUSCULAR | Status: DC
  Filled 2019-10-10: qty 0.5

## 2019-10-10 MED ORDER — LIDOCAINE HCL (PF) 1 % IJ SOLN
INTRAMUSCULAR | Status: DC | PRN
  Administered 2019-10-10: 3 mL

## 2019-10-10 MED ORDER — OXYTOCIN 10 UNIT/ML IJ SOLN
INTRAMUSCULAR | Status: AC
  Filled 2019-10-10: qty 2

## 2019-10-10 MED ORDER — ONDANSETRON HCL 4 MG/2ML IJ SOLN: 4.0000 mg | Freq: Four times a day (QID) | INTRAMUSCULAR | Status: DC | PRN

## 2019-10-10 MED ORDER — SOD CITRATE-CITRIC ACID 500-334 MG/5ML PO SOLN: 30.0000 mL | ORAL | Status: DC | PRN

## 2019-10-10 NOTE — Anesthesia Procedure Notes (Signed)
Epidural Patient location during procedure: OB Start time: 10/10/2019 2:22 PM End time: 10/10/2019 2:42 PM  Staffing Resident/CRNA: Junious Silk, CRNA Performed: resident/CRNA   Preanesthetic Checklist Completed: patient identified, IV checked, site marked, risks and benefits discussed, surgical consent, monitors and equipment checked, pre-op evaluation and timeout performed  Epidural Patient position: sitting Prep: Betadine Patient monitoring: heart rate, continuous pulse ox and blood pressure Approach: midline Location: L3-L4 Injection technique: LOR saline  Needle:  Needle type: Tuohy  Needle gauge: 17 G Needle length: 9 cm and 9 Catheter type: closed end flexible Catheter size: 20 Guage Test dose: negative and 1.5% lidocaine with Epi 1:200 K  Assessment Sensory level: T10 Events: blood not aspirated, injection not painful, no injection resistance, no paresthesia and negative IV test  Additional Notes   Patient tolerated the insertion well without complications.Reason for block:procedure for pain

## 2019-10-10 NOTE — Progress Notes (Signed)
Labor Progress Note  Angelica Tyler is a 29 y.o. G2P0010 at [redacted]w[redacted]d by LMP admitted for induction of labor due to late term pregnancy.  Subjective:  Comfortable with epidural, feeling some pressure in her right hip and sharp pain in the back on the right side.   Objective: BP (!) 103/63   Pulse 103   Temp 97.8 F (36.6 C) (Oral)   Resp 20   Ht 5\' 5"  (1.651 m)   Wt 85.3 kg   LMP 12/27/2018   SpO2 97%   BMI 31.28 kg/m   Fetal Assessment: FHT:  FHR: 135 bpm, variability: moderate,  accelerations:  Present,  decelerations:  Absent Category/reactivity:  Category I UC: regular, every 1.5-2 minutes SVE:    Dilation: complete  Effacement: complete  Station:  +2  Consistency: soft  Position: anterior  Membrane status: AROM 1705 Amniotic color: clear   Labs: Lab Results  Component Value Date   WBC 9.5 10/10/2019   HGB 12.7 10/10/2019   HCT 38.2 10/10/2019   MCV 75.9 (L) 10/10/2019   PLT 269 10/10/2019    Assessment / Plan: Induction of labor, active labor  Labor: S/p misoprostol x1, s/p foley balloon. AROM at 1705. Pitocin started at 1715, currently infusing at 3mu/min. Complete and will start pushing now.  Fetal Wellbeing:  Category I Pain Control:  Epidural I/D:  n/a Anticipated MOD:  NSVD  18m, CNM 10/10/2019, 9:17 PM

## 2019-10-10 NOTE — Anesthesia Preprocedure Evaluation (Signed)
Anesthesia Evaluation  Patient identified by MRN, date of birth, ID band Patient awake    Reviewed: Allergy & Precautions, H&P , NPO status , Patient's Chart, lab work & pertinent test results  History of Anesthesia Complications Negative for: history of anesthetic complications  Airway Mallampati: II  TM Distance: >3 FB Neck ROM: full    Dental no notable dental hx.    Pulmonary former smoker,    Pulmonary exam normal        Cardiovascular negative cardio ROS Normal cardiovascular exam     Neuro/Psych negative neurological ROS  negative psych ROS   GI/Hepatic negative GI ROS, Neg liver ROS,   Endo/Other  negative endocrine ROS  Renal/GU negative Renal ROS  negative genitourinary   Musculoskeletal   Abdominal   Peds  Hematology negative hematology ROS (+)   Anesthesia Other Findings   Reproductive/Obstetrics (+) Pregnancy                             Anesthesia Physical Anesthesia Plan  ASA: II  Anesthesia Plan:    Post-op Pain Management:    Induction:   PONV Risk Score and Plan:   Airway Management Planned:   Additional Equipment:   Intra-op Plan:   Post-operative Plan:   Informed Consent: I have reviewed the patients History and Physical, chart, labs and discussed the procedure including the risks, benefits and alternatives for the proposed anesthesia with the patient or authorized representative who has indicated his/her understanding and acceptance.       Plan Discussed with: CRNA and Anesthesiologist  Anesthesia Plan Comments:         Anesthesia Quick Evaluation

## 2019-10-10 NOTE — H&P (Signed)
OB History & Physical   History of Present Illness:  Chief Complaint: presents for scheduled IOL  HPI:  TAYLI BUCH is a 29 y.o. G2P0010 female at [redacted]w[redacted]d dated by LMP c/w [redacted]w[redacted]d ultrasound.  She presents to L&D for scheduled IOL.  She reports:  -active fetal movement -no leakage of fluid -no vaginal bleeding -no contractions  Pregnancy Issues: 1. Anemia on iron supplement  Complete records unavailable at this time   Maternal Medical History:   Past Medical History:  Diagnosis Date  . Anxiety   . Panic attack     History reviewed. No pertinent surgical history.  No Known Allergies  Prior to Admission medications   Medication Sig Start Date End Date Taking? Authorizing Provider  Prenatal Vit-Fe Fumarate-FA (PRENATAL MULTIVITAMIN) TABS tablet Take 1 tablet by mouth daily at 12 noon.    [provider]     Prenatal care site: [facility]  Social History: She  reports that she has quit smoking. Her smoking use included cigarettes. She smoked 0.00 packs per day. She has never used smokeless tobacco. She reports previous drug use. Drug: Marijuana. She reports that she does not drink alcohol.  Family History: family history is not on file.   Review of Systems: A full review of systems was performed and negative except as noted in the HPI.    Physical Exam:  Vital Signs: BP 121/75 (BP Location: Left Arm)   Pulse (!) 116   Temp 98.8 F (37.1 C) (Oral)   Resp 20   Ht 5\' 5"  (1.651 m)   Wt 85.3 kg   LMP 12/27/2018   SpO2 98%   BMI 31.28 kg/m   General:   alert, cooperative and appears stated age  Skin:  normal and no rash or abnormalities  Neurologic:    Alert & oriented x 3  Lungs:   clear to auscultation bilaterally  Heart:   regular rate and rhythm, S1, S2 normal, no murmur, click, rub or gallop  Abdomen:  soft, non-tender; bowel sounds normal; no masses,  no organomegaly  Pelvis:  External genitalia: normal general appearance  FHT:  145 BPM   Presentations: cephalic  Cervix:    Dilation: 1cm   Effacement: 60%   Station:  -2   Consistency: soft   Position: posterior  Extremities: : non-tender, symmetric, no edema bilaterally.    EFW: 7lb2oz  Results for orders placed or performed during the hospital encounter of 10/10/19 (from the past 24 hour(s))  CBC     Status: Abnormal   Collection Time: 10/10/19  8:26 AM  Result Value Ref Range   WBC 9.5 4.0 - 10.5 K/uL   RBC 5.03 3.87 - 5.11 MIL/uL   Hemoglobin 12.7 12.0 - 15.0 g/dL   HCT 10/12/19 36 - 46 %   MCV 75.9 (L) 80.0 - 100.0 fL   MCH 25.2 (L) 26.0 - 34.0 pg   MCHC 33.2 30.0 - 36.0 g/dL   RDW 22.0 (H) 25.4 - 27.0 %   Platelets 269 150 - 400 K/uL   nRBC 0.0 0.0 - 0.2 %  Type and screen     Status: None   Collection Time: 10/10/19  8:26 AM  Result Value Ref Range   ABO/RH(D) A POS    Antibody Screen NEG    Sample Expiration      10/13/2019,2359 Performed at [facility], [address], [place], [place]   Urine Drug Screen, Qualitative ([facility] only)  Status: None   Collection Time: 10/10/19  8:26 AM  Result Value Ref Range   Tricyclic, Ur Screen NONE DETECTED NONE DETECTED   Amphetamines, Ur Screen NONE DETECTED NONE DETECTED   MDMA (Ecstasy)Ur Screen NONE DETECTED NONE DETECTED   Cocaine Metabolite,Ur [place] NONE DETECTED NONE DETECTED   Opiate, Ur Screen NONE DETECTED NONE DETECTED   Phencyclidine (PCP) Ur S NONE DETECTED NONE DETECTED   Cannabinoid 50 Ng, Ur [place] NONE DETECTED NONE DETECTED   Barbiturates, Ur Screen NONE DETECTED NONE DETECTED   Benzodiazepine, Ur Scrn NONE DETECTED NONE DETECTED   Methadone Scn, Ur NONE DETECTED NONE DETECTED  Protein / creatinine ratio, urine     Status: Abnormal   Collection Time: 10/10/19  8:26 AM  Result Value Ref Range   Creatinine, Urine 55 mg/dL   Total Protein, Urine 11 mg/dL   Protein Creatinine Ratio 0.20 (H) 0.00 - 0.15 mg/mg[Cre]  ABO/Rh     Status: None   Collection Time: 10/10/19 10:00 AM   Result Value Ref Range   ABO/RH(D)      A POS Performed at [facility], [address], [place], [place] [address]     Pertinent Results:  Prenatal Labs: Blood type/Rh A+  Antibody screen neg  Rubella Immune  Varicella Nonimmune  RPR NR  HBsAg Neg  HIV NR  GC neg  Chlamydia neg  Genetic screening Quad screen done, assume negative result but unable to see result in CareEverywhere  1 hour GTT Early 1h OGTT 102 04/26/2019, no record of 28w OGTT  3 hour GTT n/a  GBS negative   FHT: FHR: 145 bpm, variability: moderate,  accelerations:  Present,  decelerations:  Absent Category/reactivity:  Category I TOCO: irregular, every 10 minutes   Assessment:  CARIANN KINNAMON is a 29 y.o. G92P0010 female at [redacted]w[redacted]d with late term pregnancy.   Plan:  1. Admit to Labor & Delivery; consents reviewed and obtained  2. Fetal Well being  - Fetal Tracing: category I - GBS negative - Presentation: cephalic confirmed by Leopold's and vaginal exam   3. Routine OB: - Prenatal labs reviewed, as above - Rh positive - CBC & T&S on admit - Clear fluids, IVF  4. Induction of Labor -  Contractions monitored with external toco in place -  Plan for induction with misoprsotol -  Plan for continuous fetal monitoring  -  Maternal pain control as desired: IVPM, nitrous, regional anesthesia - Anticipate vaginal delivery  5. Post Partum Planning: - Infant feeding: TBD - Contraception: TBD  Genia Del, CNM 10/10/2019 11:08 AM ----- Genia Del Certified Nurse Midwife [facility], Department of OB/GYN [facility]

## 2019-10-10 NOTE — Progress Notes (Signed)
Labor Progress Note  Angelica Tyler is a 29 y.o. G2P0010 at [redacted]w[redacted]d by LMP admitted for induction of labor due to late term pregnancy.  Subjective:  Comfortable with epidural in place  Objective: BP (!) 105/63   Pulse (!) 107   Temp 98.1 F (36.7 C) (Oral)   Resp 20   Ht 5\' 5"  (1.651 m)   Wt 85.3 kg   LMP 12/27/2018   SpO2 98%   BMI 31.28 kg/m   Fetal Assessment: FHT:  FHR: 135 bpm, variability: moderate,  accelerations:  Present,  decelerations:  Absent Category/reactivity:  Category I UC: Irregular q1.5 minutes SVE:    Dilation: 4-5cm  Effacement: 70%  Station:  -2  Consistency: soft  Position: anterior  Membrane status: AROM now Amniotic color: clear   Labs: Lab Results  Component Value Date   WBC 9.5 10/10/2019   HGB 12.7 10/10/2019   HCT 38.2 10/10/2019   MCV 75.9 (L) 10/10/2019   PLT 269 10/10/2019    Assessment / Plan: Induction of labor, latent labor  Labor: S/p misoprostol x1, s/p foley balloon. AROM now with IUPC placed. Plan to start pitocin if MVUs inadequate, then titrate per protocol to adequate MVUs.  Fetal Wellbeing:  Category I Pain Control:  Epidural I/D:  n/a Anticipated MOD:  NSVD  10/12/2019, CNM 10/10/2019, 5:09 PM

## 2019-10-10 NOTE — Progress Notes (Signed)
Ch attempted visit with Pt to give AD education. Pt asleep at this time. Ch left AD packet in the room. Ch let Pt's RN know to tell Pt to go over the AD, and page chaplain for further questions and support when pt is awake and ready.

## 2019-10-10 NOTE — Progress Notes (Signed)
Labor Progress Note  Angelica Tyler is a 29 y.o. G2P0010 at [redacted]w[redacted]d by LMP admitted for induction of labor due to late term pregnancy.  Subjective:  Feeling regular cramping/contractions.   Objective: BP (!) 133/88   Pulse 100   Temp 98.1 F (36.7 C) (Oral)   Resp 20   Ht 5\' 5"  (1.651 m)   Wt 85.3 kg   LMP 12/27/2018   SpO2 98%   BMI 31.28 kg/m   Fetal Assessment: FHT:  FHR: 135 bpm, variability: moderate,  accelerations:  Present,  decelerations:  Absent Category/reactivity:  Category I UC: Irritability to q1.5-5 minutes SVE:    Dilation: 1cm  Effacement: 70%  Station:  -2  Consistency: soft  Position: anterior  Membrane status: intact Amniotic color: n/a  Labs: Lab Results  Component Value Date   WBC 9.5 10/10/2019   HGB 12.7 10/10/2019   HCT 38.2 10/10/2019   MCV 75.9 (L) 10/10/2019   PLT 269 10/10/2019    Assessment / Plan: Induction of labor, latent labor  Labor: S/p misoprostol x1, foley balloon placed now. Plan to start pitocin if contractions space out.  Fetal Wellbeing:  Category I Pain Control:  Maternal pain control as desired: IVPM, nitrous, regional anesthesia I/D:  n/a Anticipated MOD:  NSVD  10/12/2019, CNM 10/10/2019, 1:07 PM

## 2019-10-10 NOTE — Discharge Instructions (Signed)

## 2019-10-10 NOTE — Discharge Summary (Signed)
Obstetric Discharge Summary   Patient Name: Angelica Tyler DOB: 19-Oct-1990 MRN: [id number]  Date of Admission: 10/10/2019 Date of Delivery: 10/10/2019 Delivered by: Genia Del, CNM Date of Discharge: 10/12/2019  Primary OB:  [facility] GBT:DVVOHYW'V last menstrual period was 12/27/2018. EDC Estimated Date of Delivery: 10/03/19 Gestational Age at Delivery: [redacted]w[redacted]d   Antepartum complications:  1. Anemia on iron supplement 2. Varicella non-immune 3. History of panic attacks and depression, no meds 4. Pruritis, normal bile acid levels  Admitting Diagnosis: planned induction of labor  Secondary Diagnoses: Patient Active Problem List   Diagnosis Date Noted  . Labor and delivery indication for care or intervention 10/10/2019  . Uterine contractions during pregnancy 10/06/2019    Induction/Augmentation: AROM, Pitocin, Cytotec and IP Foley Complications: None Intrapartum complications/course: Angelica Tyler presented to L&D with planned induction of labor for late term pregnancy. She was induced with one dose of misoprostol. Foley balloon later place. AROM and pitocin augmentation after expulsion of foley balloon. Epidural placed for pain relief. She progressed to complete and had a spontaneous vaginal birth of a live female, pushing over an intact perineum. The fetal head was delivered in direct OA position with restitution to ROA. No nuchal cord. Anterior then posterior shoulders delivered with minimal assistance. Baby placed on mom's abdomen and attended to by transition RN. Cord clamped and cut when pulseless by father of the baby. Placenta delivered spontaneously intact with 3VC. Uterine tone firm with massage and bleeding initially brisk then slowed with uterine massage. Bleeding increased after cessation of uterine massage, so rectal misoprostol was given in addition to IV pitocin for hemorrhage prophylaxis. Right labial and 2nd degree left vaginal wall laceration identified. Repair 3-0  Vicryl Rapide CT, 3-0 Vicryl SH. Quant. Blood Loss (mL): 672. Delivery Type: spontaneous vaginal delivery Anesthesia: epidural Placenta: spontaneous Laceration: Right labial, 2nd degree vaginal Episiotomy: none  Newborn Data: Live born female "Angelica Tyler" Birth Weight:  7#14 APGAR: 9, 9  Newborn Delivery   Birth date/time: 10/10/2019 22:02:00 Delivery type: Vaginal, Spontaneous         Postpartum Course  Patient had an uncomplicated postpartum course.  By time of discharge on PPD#2, her pain was controlled on oral pain medications; she had appropriate lochia and was ambulating, voiding without difficulty and tolerating regular diet.  She was deemed stable for discharge to home.       EdinburghInocente Tyler Postnatal Depression Scale Screening Tool 10/11/2019  I have been able to laugh and see the funny side of things. 0  I have looked forward with enjoyment to things. 0  I have blamed myself unnecessarily when things went wrong. 1  I have been anxious or worried for no good reason. 0  I have felt scared or panicky for no good reason. 0  Things have been getting on top of me. 0  I have been so unhappy that I have had difficulty sleeping. 0  I have felt sad or miserable. 0  I have been so unhappy that I have been crying. 0  The thought of harming myself has occurred to me. 0  Edinburgh Postnatal Depression Scale Total 1     Labs: CBC Latest Ref Rng & Units 10/11/2019 10/10/2019  WBC 4.0 - 10.5 K/uL 14.8(H) 9.5  Hemoglobin 12.0 - 15.0 g/dL 11.1(L) 12.7  Hematocrit 36 - 46 % 34.8(L) 38.2  Platelets 150 - 400 K/uL 226 269   A POS Performed at [facility], [address] [place] [place], [place] [address]  Physical exam:  BP 114/82 (BP Location: Right Arm)   Pulse 93   Temp 98.6 F (37 C)   Resp 18   Ht 5\' 5"  (1.651 m)   Wt 85.3 kg   LMP 12/27/2018   SpO2 100%   Breastfeeding Unknown   BMI 31.28 kg/m  General: alert and no distress Pulm: normal respiratory  effort Lochia: appropriate Abdomen: soft, NT Uterine Fundus: firm, below umbilicus Extremities: No evidence of DVT seen on physical exam. No lower extremity edema.   Disposition: stable, discharge to home Baby Feeding: breastmilk  Baby Disposition: home with mom  Contraception: Nexplanon  Prenatal Labs:  Blood type/Rh A+  Antibody screen neg  Rubella Immune  Varicella Non-immune  RPR NR  HBsAg Neg  HIV NR  GC neg  Chlamydia neg  Genetic screening Quad screen done, assume negative result but unable to see in CareEverywhere  1 hour GTT Early 1h OGTT 102 04/26/2019 28w 1h OGTT 94 07/17/2019  3 hour GTT n/a  GBS negative   Rh Immune globulin given: n/a Rubella vaccine given: n/a Varicella vaccine given: ordered to be given prior to DC Tdap vaccine given in AP or PP setting: 07/17/2019  Plan:  07/19/2019 was discharged to home in good condition. Follow-up appointment at [facility] with delivery provider in 2 weeks  Discharge Instructions: Per After Visit Summary. Activity: Advance as tolerated. Pelvic rest for 6 weeks.   Diet: Regular Discharge Medications: Allergies as of 10/12/2019   No Known Allergies     Medication List    TAKE these medications   acetaminophen 325 MG tablet Commonly known as: Tylenol Take 2 tablets (650 mg total) by mouth every 4 (four) hours as needed (for pain scale < 4).   benzocaine-Menthol 20-0.5 % Aero Commonly known as: DERMOPLAST Apply 1 application topically as needed for irritation (perineal discomfort).   ferrous sulfate 325 (65 FE) MG tablet Take 1 tablet (325 mg total) by mouth 2 (two) times daily with a meal.   ibuprofen 600 MG tablet Commonly known as: ADVIL Take 1 tablet (600 mg total) by mouth every 6 (six) hours.   prenatal multivitamin Tabs tablet Take 1 tablet by mouth daily at 12 noon.   senna-docusate 8.6-50 MG tablet Commonly known as: Senokot-S Take 2 tablets by mouth daily.   witch  hazel-glycerin pad Commonly known as: TUCKS Apply 1 application topically continuous.      Outpatient follow up:   Follow-up Information    06-27-1978, CNM. Schedule an appointment as soon as possible for a visit in 2 week(s).   Specialty: Certified Nurse Midwife Why: For postpartum mood check Contact information: [address] [place] [phone]                Signed: [phone], CNM 10/12/2019 9:45 AM

## 2019-10-11 LAB — CBC
HCT: 34.8 % — ABNORMAL LOW (ref 36.0–46.0)
Hemoglobin: 11.1 g/dL — ABNORMAL LOW (ref 12.0–15.0)
MCH: 25 pg — ABNORMAL LOW (ref 26.0–34.0)
MCHC: 31.9 g/dL (ref 30.0–36.0)
MCV: 78.4 fL — ABNORMAL LOW (ref 80.0–100.0)
Platelets: 226 10*3/uL (ref 150–400)
RBC: 4.44 MIL/uL (ref 3.87–5.11)
RDW: 19.6 % — ABNORMAL HIGH (ref 11.5–15.5)
WBC: 14.8 10*3/uL — ABNORMAL HIGH (ref 4.0–10.5)
nRBC: 0 % (ref 0.0–0.2)

## 2019-10-11 LAB — RPR: RPR Ser Ql: NONREACTIVE

## 2019-10-11 MED ORDER — FERROUS SULFATE 325 (65 FE) MG PO TABS
325.0000 mg | ORAL_TABLET | Freq: Two times a day (BID) | ORAL | Status: DC
  Administered 2019-10-11 – 2019-10-12 (×3): 325 mg via ORAL
  Filled 2019-10-11 (×4): qty 1

## 2019-10-11 MED ORDER — SENNOSIDES-DOCUSATE SODIUM 8.6-50 MG PO TABS
2.0000 | ORAL_TABLET | ORAL | Status: DC
  Administered 2019-10-11 – 2019-10-12 (×2): 2 via ORAL
  Filled 2019-10-11 (×2): qty 2

## 2019-10-11 MED ORDER — DIPHENHYDRAMINE HCL 25 MG PO CAPS
25.0000 mg | ORAL_CAPSULE | Freq: Four times a day (QID) | ORAL | Status: DC | PRN
  Administered 2019-10-11: 25 mg via ORAL

## 2019-10-11 MED ORDER — SIMETHICONE 80 MG PO CHEW: 160.0000 mg | CHEWABLE_TABLET | ORAL | Status: DC | PRN

## 2019-10-11 MED ORDER — COCONUT OIL OIL
1.0000 "application " | TOPICAL_OIL | Status: DC | PRN
  Filled 2019-10-11: qty 120

## 2019-10-11 MED ORDER — ONDANSETRON HCL 4 MG PO TABS: 4.0000 mg | ORAL_TABLET | ORAL | Status: DC | PRN

## 2019-10-11 MED ORDER — WITCH HAZEL-GLYCERIN EX PADS
1.0000 "application " | MEDICATED_PAD | CUTANEOUS | Status: DC
  Administered 2019-10-11: 1 via TOPICAL
  Filled 2019-10-11 (×2): qty 100

## 2019-10-11 MED ORDER — IBUPROFEN 600 MG PO TABS
600.0000 mg | ORAL_TABLET | Freq: Four times a day (QID) | ORAL | Status: DC
  Administered 2019-10-11 – 2019-10-12 (×5): 600 mg via ORAL
  Filled 2019-10-11 (×6): qty 1

## 2019-10-11 MED ORDER — ONDANSETRON HCL 4 MG/2ML IJ SOLN: 4.0000 mg | INTRAMUSCULAR | Status: DC | PRN

## 2019-10-11 MED ORDER — DIBUCAINE (PERIANAL) 1 % EX OINT
1.0000 "application " | TOPICAL_OINTMENT | CUTANEOUS | Status: DC | PRN
  Filled 2019-10-11: qty 28

## 2019-10-11 MED ORDER — DIPHENHYDRAMINE HCL 25 MG PO CAPS
ORAL_CAPSULE | ORAL | Status: AC
  Filled 2019-10-11: qty 1

## 2019-10-11 MED ORDER — PRENATAL MULTIVITAMIN CH
1.0000 | ORAL_TABLET | Freq: Every day | ORAL | Status: DC
  Administered 2019-10-11 – 2019-10-12 (×2): 1 via ORAL
  Filled 2019-10-11: qty 1

## 2019-10-11 NOTE — Progress Notes (Signed)
Post Partum Day 1 Subjective: Doing well, no complaints.  Tolerating regular diet, pain with PO meds, voiding and ambulating without difficulty.  No CP SOB Fever,Chills, N/V or leg pain; denies nipple or breast pain, no HA change of vision, RUQ/epigastric pain  Objective: BP 106/69 (BP Location: Left Arm)   Pulse 100   Temp 98.2 F (36.8 C) (Oral)   Resp 18   Ht 5\' 5"  (1.651 m)   Wt 85.3 kg   LMP 12/27/2018   SpO2 98%   Breastfeeding Unknown   BMI 31.28 kg/m    Physical Exam:  General: NAD Breasts: soft/nontender CV: RRR Pulm: nl effort, CTABL Abdomen: soft, NT, BS x 4 Perineum: minimal edema, laceration repair well approximated Lochia: small Uterine Fundus: fundus firm and 2 fb below umbilicus DVT Evaluation: no cords, ttp LEs   Recent Labs    10/10/19 0826 10/11/19 0548  HGB 12.7 11.1*  HCT 38.2 34.8*  WBC 9.5 14.8*  PLT 269 226    Assessment/Plan: 29 y.o. G2P1011 postpartum day # 1  - Continue routine PP care - Lactation consult prn  - Discussed contraceptive options including implant, IUDs hormonal and non-hormonal, injection, pills/ring/patch, condoms, and NFP. Pt desires Nexplanon - Immunization status: Needs varicella prior to DC    Disposition: Does not desire Dc home today.     10/13/19, CNM 10/11/2019  12:31 PM

## 2019-10-11 NOTE — Anesthesia Postprocedure Evaluation (Signed)
Anesthesia Post Note  Patient: Angelica Tyler  Procedure(s) Performed: AN AD HOC LABOR EPIDURAL  Patient location during evaluation: Mother Baby Anesthesia Type: Epidural Level of consciousness: awake and alert Pain management: pain level controlled Vital Signs Assessment: post-procedure vital signs reviewed and stable Respiratory status: spontaneous breathing, nonlabored ventilation and respiratory function stable Cardiovascular status: stable Postop Assessment: no headache, no backache and epidural receding Anesthetic complications: no   No complications documented.   Last Vitals:  Vitals:   10/11/19 0221 10/11/19 0706  BP: 128/78 (!) 129/78  Pulse: 101 95  Resp: 18 18  Temp: 36.8 C 36.9 C  SpO2: 99% 99%    Last Pain:  Vitals:   10/11/19 0706  TempSrc: Oral  PainSc:                  Elmarie Mainland

## 2019-10-12 MED ORDER — ACETAMINOPHEN 325 MG PO TABS: 650.0000 mg | ORAL_TABLET | ORAL | 0 refills | Status: AC | PRN

## 2019-10-12 MED ORDER — WITCH HAZEL-GLYCERIN EX PADS: 1.0000 "application " | MEDICATED_PAD | CUTANEOUS | 12 refills | Status: AC

## 2019-10-12 MED ORDER — IBUPROFEN 600 MG PO TABS: 600.0000 mg | ORAL_TABLET | Freq: Four times a day (QID) | ORAL | 0 refills | Status: AC

## 2019-10-12 MED ORDER — BENZOCAINE-MENTHOL 20-0.5 % EX AERO: 1.0000 "application " | INHALATION_SPRAY | CUTANEOUS | 0 refills | Status: AC | PRN

## 2019-10-12 MED ORDER — SENNOSIDES-DOCUSATE SODIUM 8.6-50 MG PO TABS: 2.0000 | ORAL_TABLET | ORAL | 0 refills | Status: AC

## 2019-10-12 MED ORDER — FERROUS SULFATE 325 (65 FE) MG PO TABS: 325.0000 mg | ORAL_TABLET | Freq: Two times a day (BID) | ORAL | 0 refills | Status: AC

## 2019-10-12 NOTE — Lactation Note (Addendum)
This note was copied from a baby's chart. Lactation Consultation Note  Patient Name: Angelica Tyler JMEQA'S Date: 10/12/2019 Reason for consult: Follow-up assessment;Mother's request;Primapara;Term;Other (Comment) (Mom requesting bottle- Agreed to give [name] via SNS at breast)  Mom is putting [name] to the breast as often as he demonstrates feeding cues.  But she is getting frustrated, because he will suck for 5 minutes and then go to sleep.  As soon as she removes him from the breast, he screams.  Mom is so exhausted and discouraged that she is asking for a bottle.  Mom agreed to [name] for supplementation and SNS to give it with at the breast to prevent nipple confusion.  Initially we had difficulty getting him to latch with SNS at the breast.  He only wanted the tip of the nipple and was pinching coming on and off the breast.  Once we got a deep enough latch, he started good rhythmic sucking with spontaneous audible swallows.  We had to lower the SNS bottle, so he did not take the DBM too fast.  He took the entire 15 ml of DBM via SNS at the breast and then came off the breast satiated.  Checked on them over 30 minutes later.  He was still sleeping in the crib contented and mom was finally getting some sleep.  No spitting was noted.   Maternal Data Formula Feeding for Exclusion: No Has patient been taught Hand Expression?: Yes Does the patient have breastfeeding experience prior to this delivery?: No (P1)  Feeding Feeding Type: Breast Fed  LATCH Score Latch: Repeated attempts needed to sustain latch, nipple held in mouth throughout feeding, stimulation needed to elicit sucking reflex.  Audible Swallowing: Spontaneous and intermittent  Type of Nipple: Everted at rest and after stimulation (Everts easily when compressed)  Comfort (Breast/Nipple): Filling, red/small blisters or bruises, mild/mod discomfort  Hold (Positioning): Assistance needed to correctly position infant at breast and  maintain latch. (Using SNS at the breast)  LATCH Score: 7  Interventions Interventions: Assisted with latch;Breast massage;Reverse pressure;Breast compression;Adjust position;Support pillows;Position options;Coconut oil;Comfort gels  Lactation Tools Discussed/Used Tools: Coconut oil;Comfort gels WIC Program: Yes   Consult Status Consult Status: PRN Date: 10/12/19 Follow-up type: Call as needed    [name] 10/12/2019, 3:40 PM

## 2019-10-12 NOTE — Progress Notes (Signed)
Discharge instructions complete and prescriptions sent to the pharmacy by the provider. Patient verbalizes understanding of teaching.  

## 2019-10-12 NOTE — Lactation Note (Signed)
This note was copied from a baby's chart. Lactation Consultation Note  Patient Name: Angelica Tyler LHTDS'K Date: 10/12/2019 Reason for consult: Initial assessment;Mother's request;Primapara;Term;Other (Comment) (Mom reports continually wanting to suck since 5 am)  When went in to check on mom, [name] was fussy and trying to suck on his blanket.  Mom reporting that he just came off the right breast and that he has been on the breast almost continually since 5 am.  Explained cluster feeding and supply and demand.  Mom willing to put him back to the breast.  Demonstrated hand expression, but only saw one drop after several attempts.  He latched after a couple of tries and immediately started strong, rhythmic sucking, but only an occasional swallow was heard after 20 minutes of nursing.  He was asleep appearing satiated after coming off the breast.  Within 15 minutes [name] was crying and rooting putting his hands to his mouth which is what mom said he had been doing since 5 am.  Mom was adamant about not introducing a pacifier.  Mom was exhausted.  [name] sucked on a gloved finger and seemed contented.  Suggested to FOB to hold and let [name] suck on his finger which he agreed to do.  Encouraged mom to make sure he always breast fed well before letting him suck on anything artificial and if was not contended sucking on finger to put him back to the breast.  Mom was getting tender nipples which was evident when tried to hand express colostrum.  Coconut oil and comfort gels given with instructions in alternating use.  Reviewed normal newborn stomach size, adequate intake and out put, feeding cues, normal course of lactation and routine newborn feeding patterns. Mom and baby plan to go home today.  Lactation [employer] social research [employer] given and reviewed.  Lactation name and number written on white board and encouraged to call with any questions, concerns or assistance.    Maternal Data Formula Feeding for  Exclusion: No Has patient been taught Hand Expression?: Yes Does the patient have breastfeeding experience prior to this delivery?: No (P1)  Feeding Feeding Type: Breast Fed  LATCH Score Latch: Repeated attempts needed to sustain latch, nipple held in mouth throughout feeding, stimulation needed to elicit sucking reflex.  Audible Swallowing: A few with stimulation  Type of Nipple: Everted at rest and after stimulation  Comfort (Breast/Nipple): Filling, red/small blisters or bruises, mild/mod discomfort  Hold (Positioning): No assistance needed to correctly position infant at breast.  LATCH Score: 7  Interventions Interventions: Breast feeding basics reviewed;Breast massage;Reverse pressure;Breast compression;Adjust position;Support pillows;Position options;Coconut oil;Comfort gels  Lactation Tools Discussed/Used Tools: Coconut oil;Comfort gels WIC Program: Yes   Consult Status Consult Status: PRN    [name] 10/12/2019, 11:31 AM

## 2019-10-13 NOTE — Discharge Summary (Signed)
Pt discharged home via WC per [place] NT with baby. All belongings sent home with pt

## 2021-11-29 IMAGING — US US OB COMP LESS 14 WK
1 series · 14 of 28 positions shown · non-contrast
Comparison: None.

CLINICAL DATA: Unsure of LMP.  Positive pregnancy test.

EXAM:
OBSTETRIC <14 WK ULTRASOUND
TECHNIQUE: Transabdominal ultrasound was performed for evaluation of the
gestation as well as the maternal uterus and adnexal regions.

[Series 1: us ob comp less 14 wk · 14 of 48 slices shown]
[im 2/48]
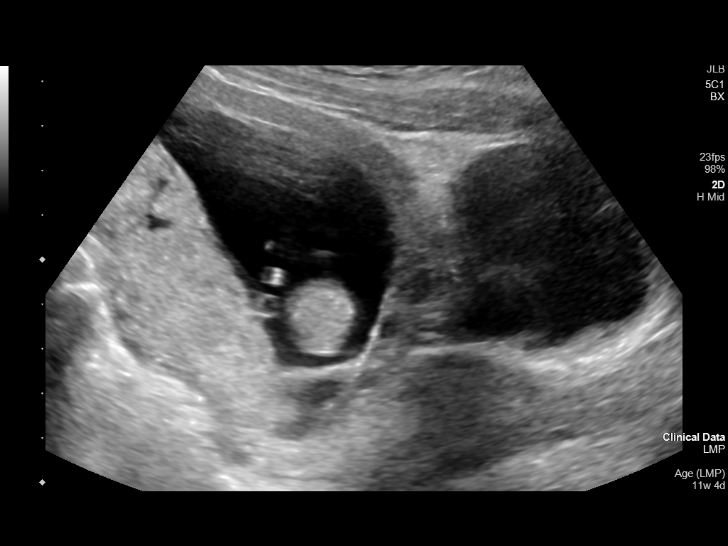
[im 6/48]
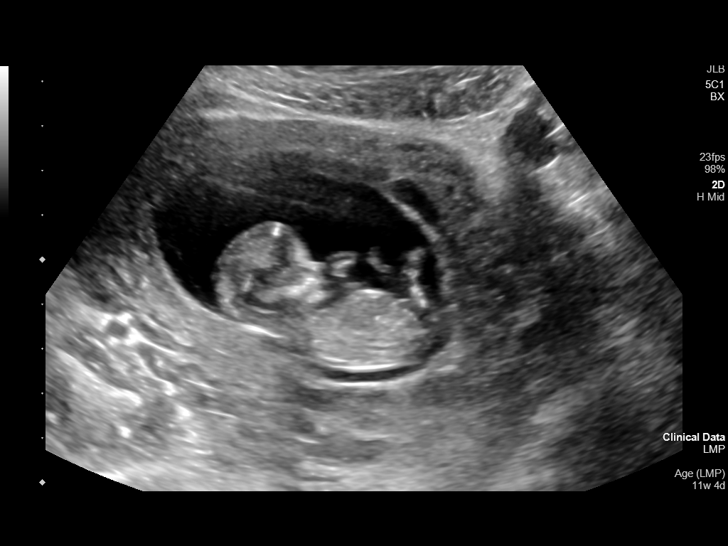
[im 9/48]
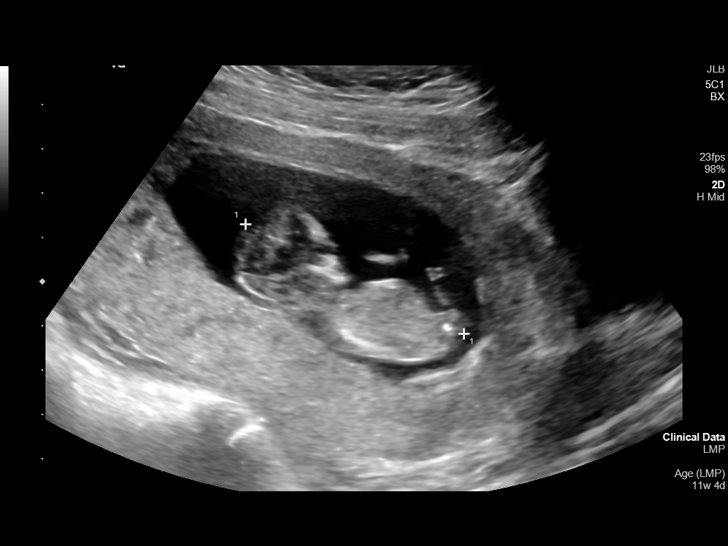
[im 13/48]
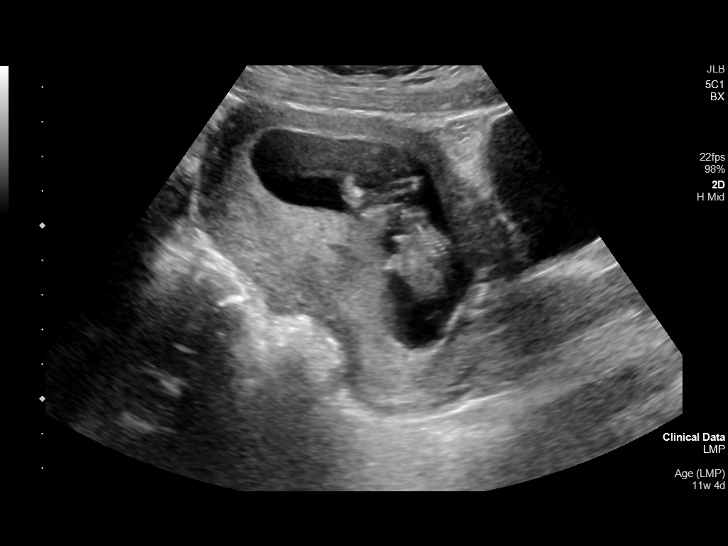
[im 16/48]
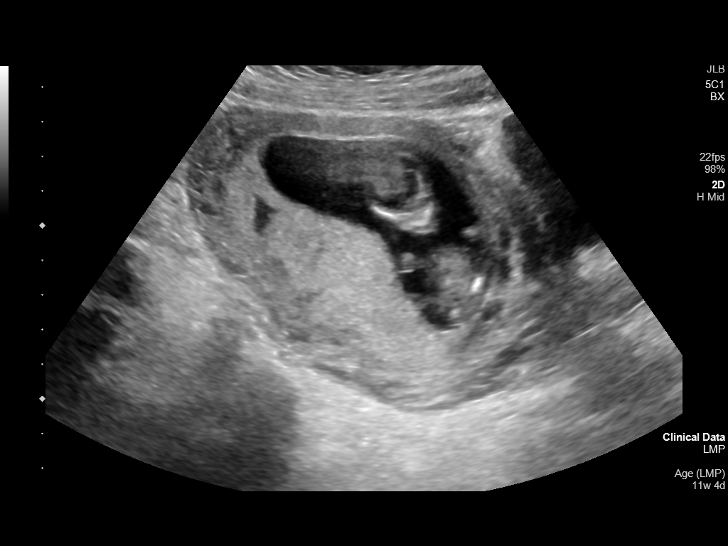
[im 20/48]
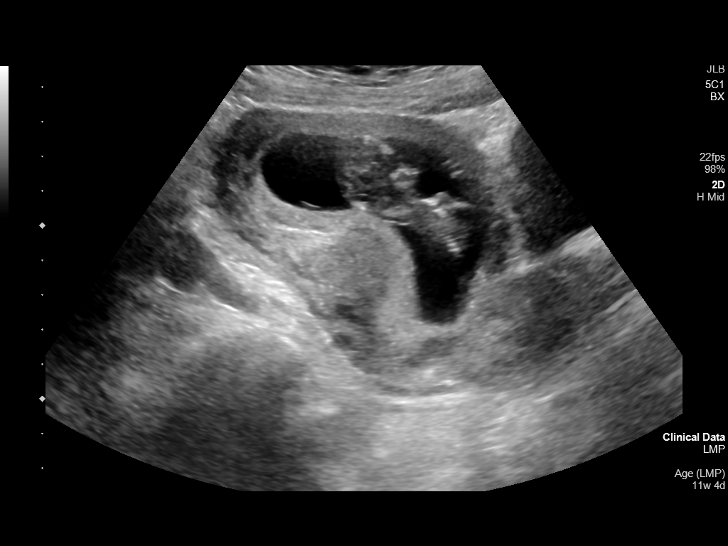
[im 23/48]
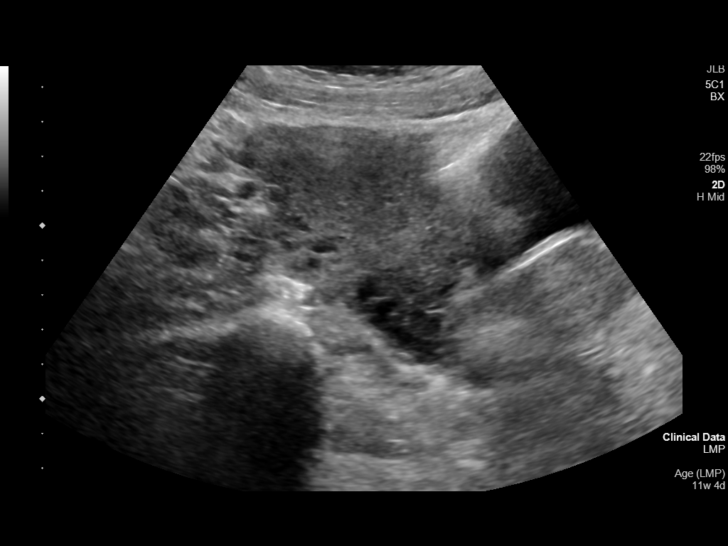
[im 27/48]
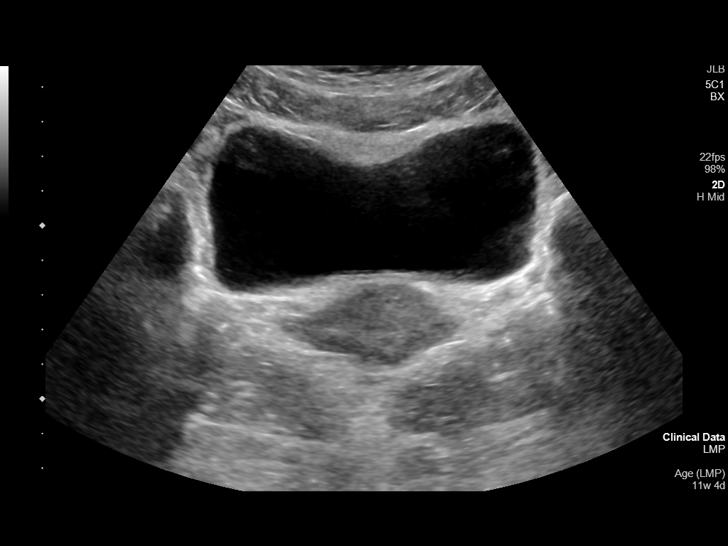
[im 30/48]
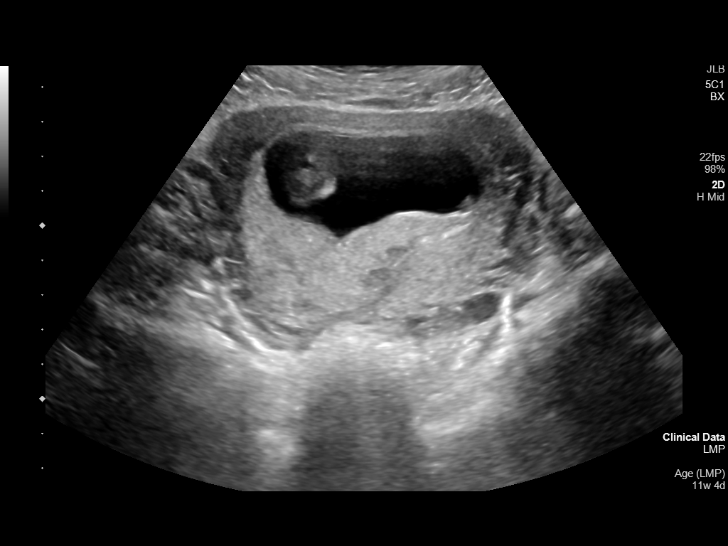
[im 34/48]
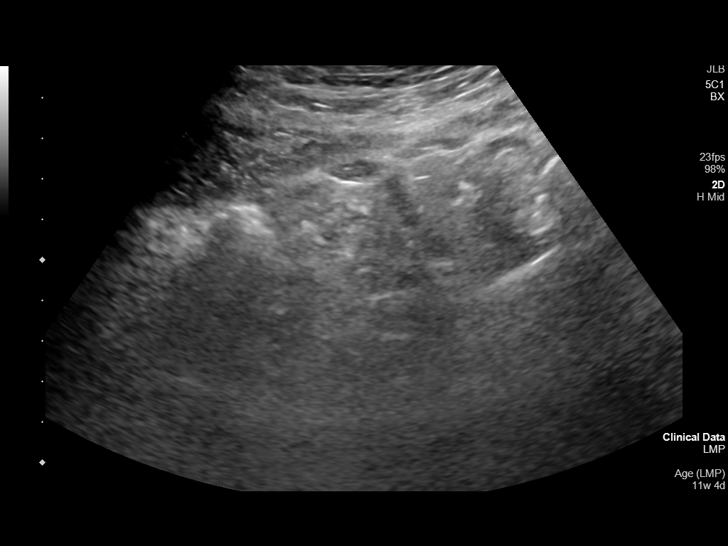
[im 37/48]
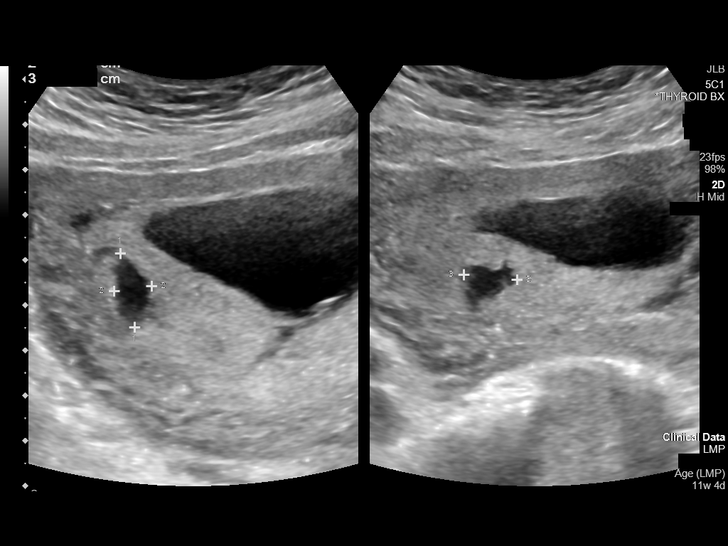
[im 41/48]
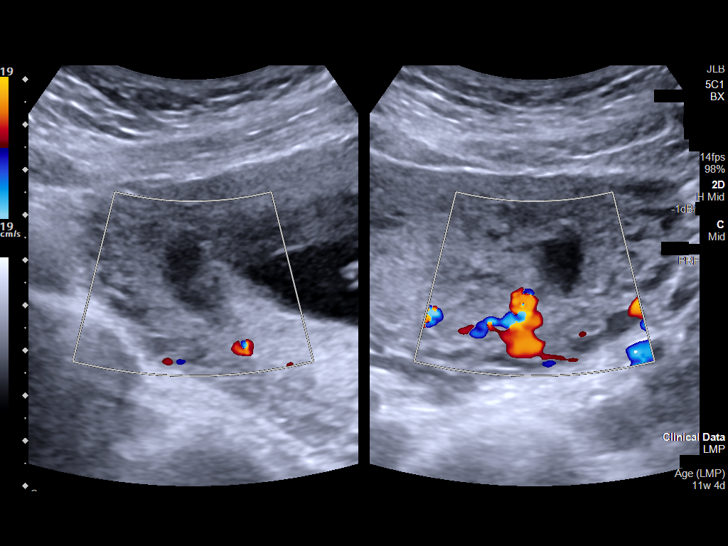
[im 44/48]
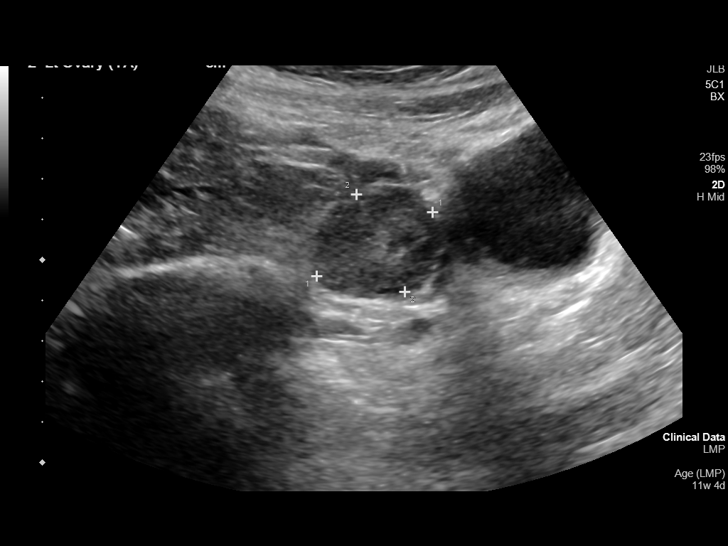
[im 48/48]
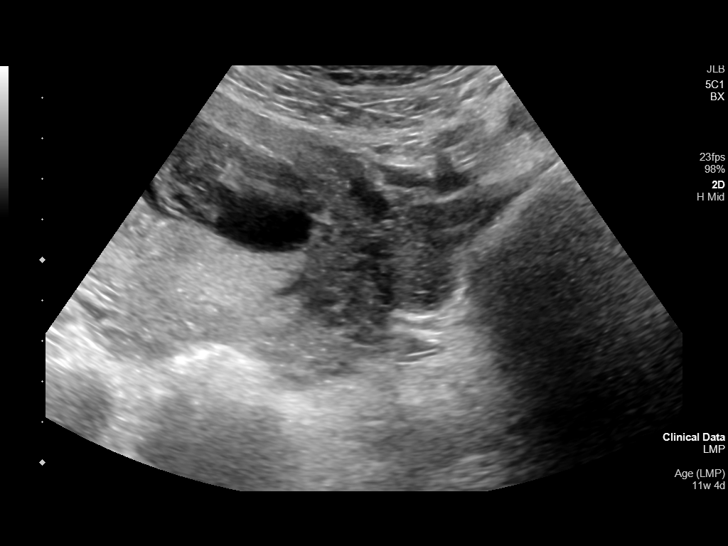

[14 of 28 positions shown; findings below may reference images not displayed]

FINDINGS: Intrauterine gestational sac: Single

Yolk sac:  Visualized.

Embryo:  Visualized.

Cardiac Activity: Visualized.

Heart Rate: 169 bpm

CRL:   55 mm   12 w 1 d                  US EDC: 09/29/2019

Subchorionic hemorrhage:  None visualized.

Maternal uterus/adnexae: Normal appearance of left ovary. Right
ovary not directly visualized, however no mass or abnormal free
fluid identified.
IMPRESSION: Single living IUP with estimated gestational age of 12 weeks 1 day,
and US EDC of 09/29/2019.

## 2022-05-17 IMAGING — US US OB FOLLOW-UP
1 of 2 series · 13 of 28 positions shown · non-contrast
Comparison: none

CLINICAL DATA: Measuring large dates. Evaluate fetal growth and
amniotic fluid.

EXAM:
OBSTETRIC 14+ WK ULTRASOUND FOLLOW-UP

[Series 1: us ob follow-up · 0.23mm/px · 13 of 37 slices shown]
[im 2/37]
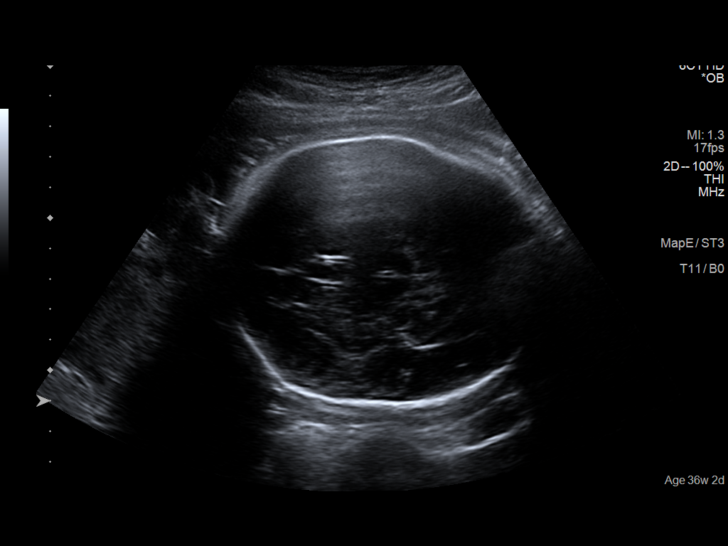
[im 5/37]
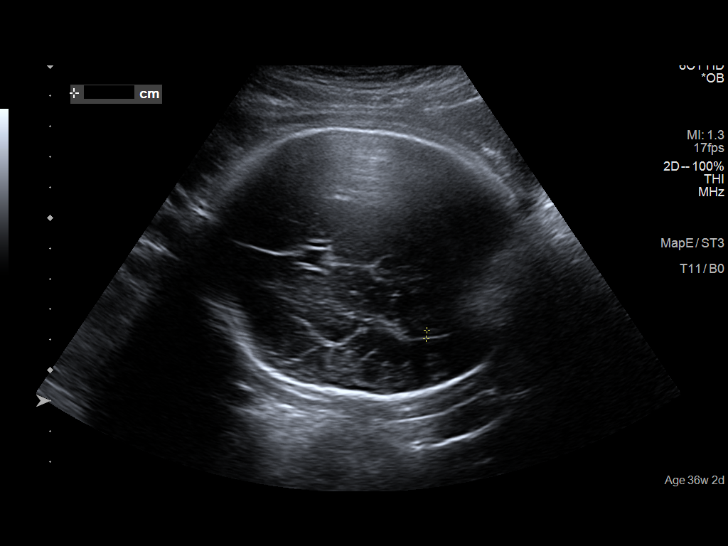
[im 7/37]
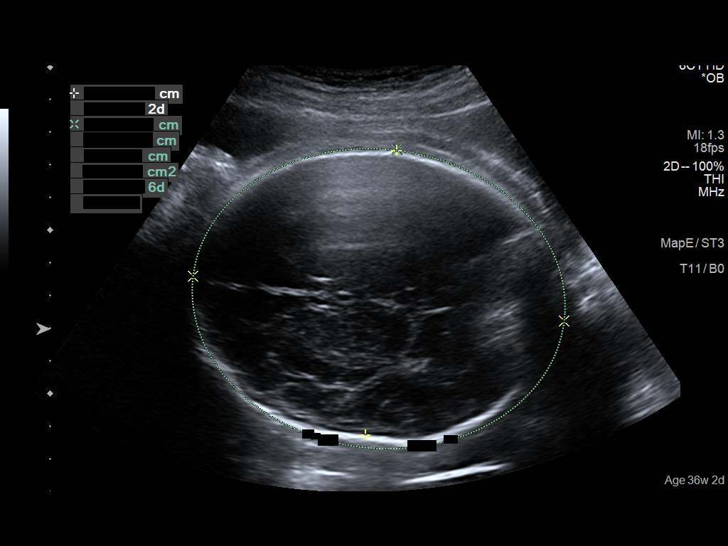
[im 10/37]
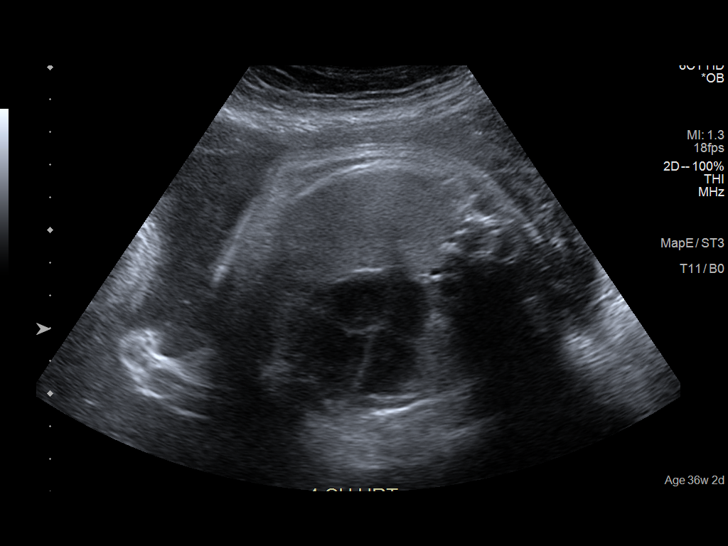
[im 13/37]
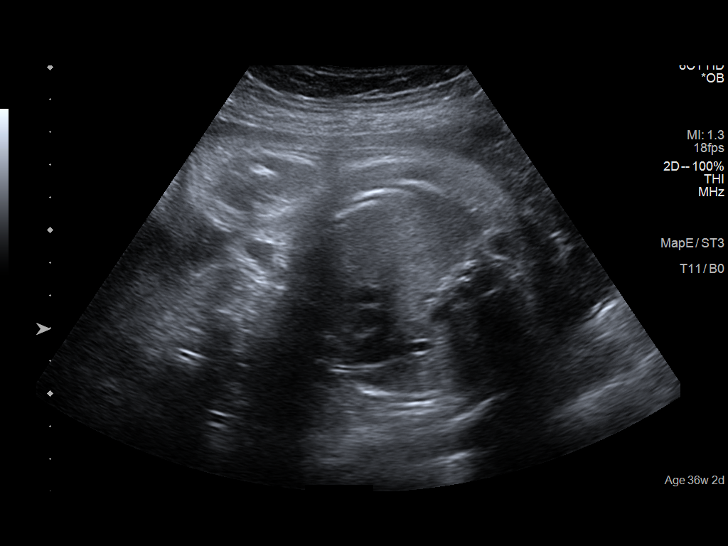
[im 16/37]
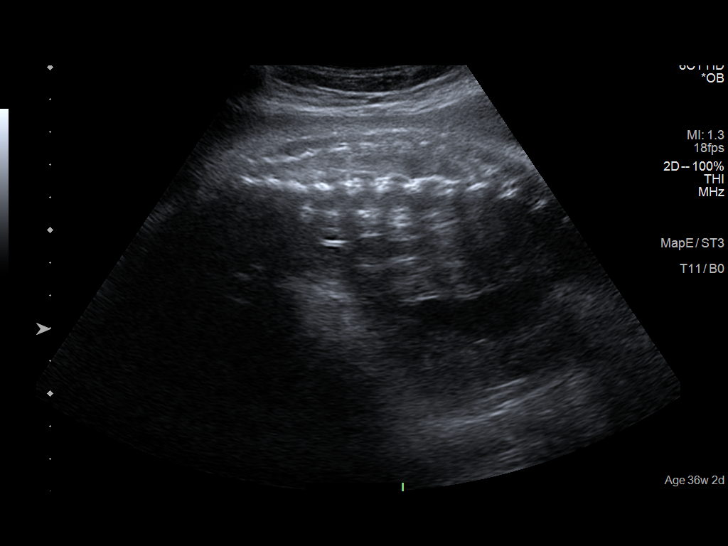
[im 20/37]
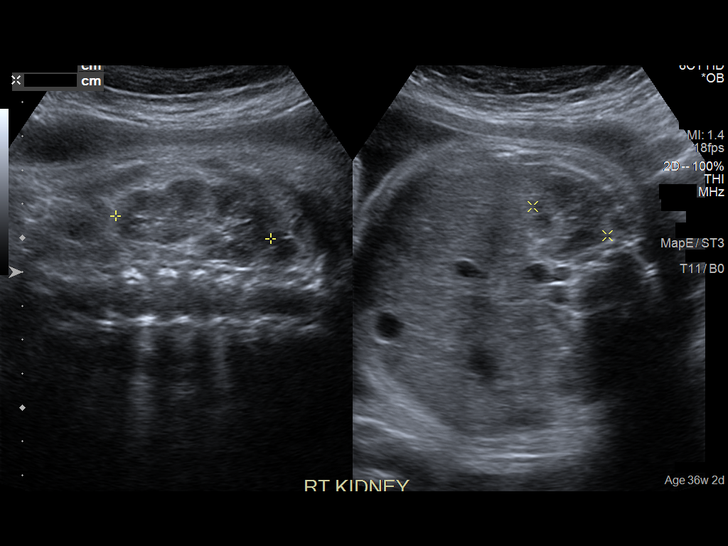
[im 23/37]
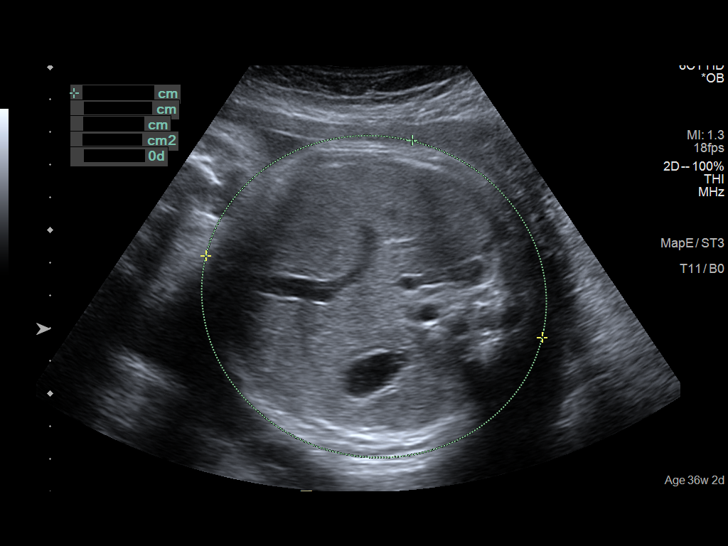
[im 25/37]
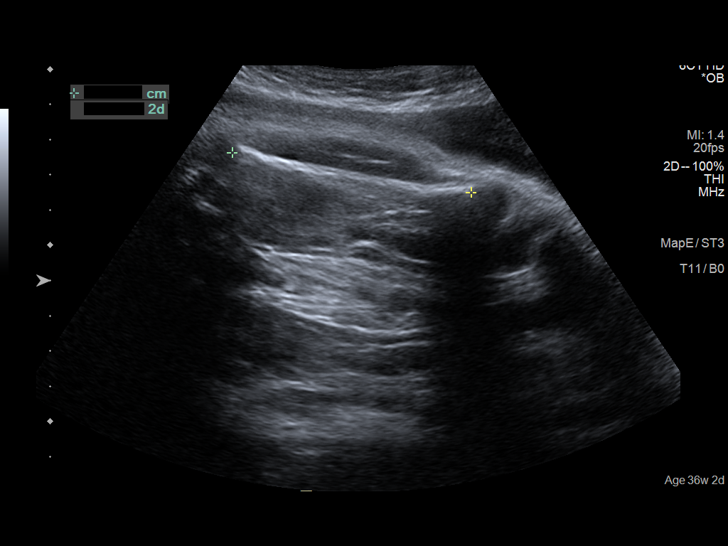
[im 28/37]
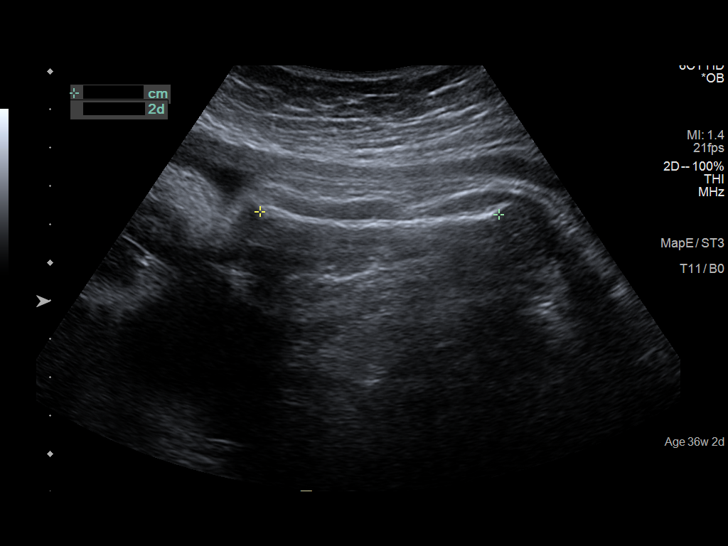
[im 31/37]
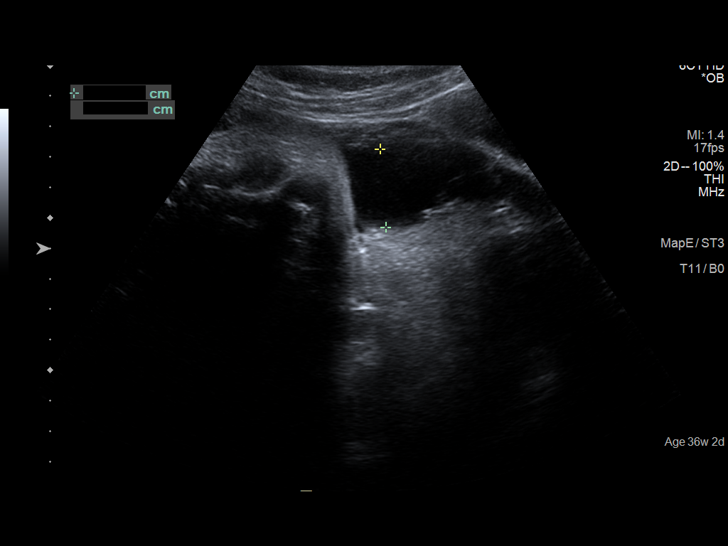
[im 34/37]
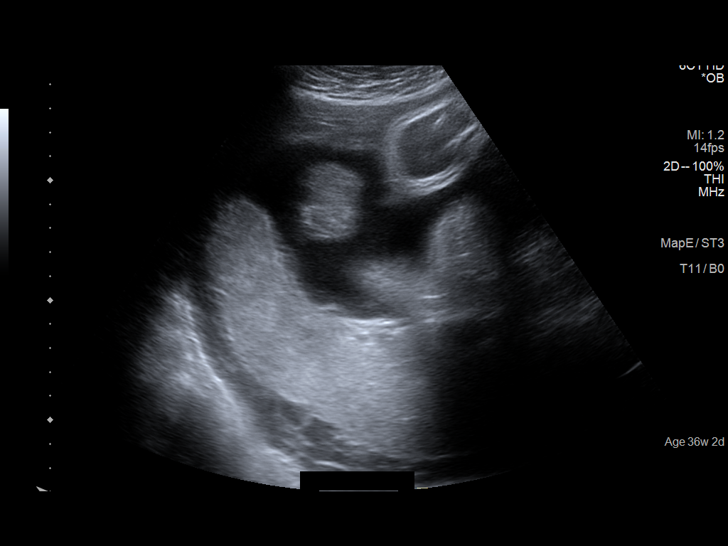
[im 37/37]
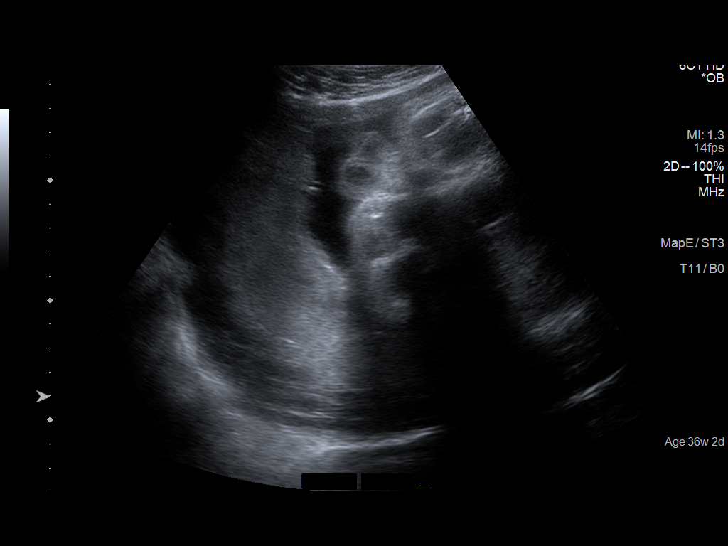

[13 of 28 positions shown; findings below may reference images not displayed]

FINDINGS: Number of Fetuses: 1

Heart Rate:  157 bpm

Movement: Yes

Presentation: Cephalic

Previa: No

Placental Location: Posterior and right lateral

Amniotic Fluid (Subjective): Within normal limits

Amniotic Fluid (Objective):

Vertical pocket 5.4cm

AFI 15.1 cm (5%ile= 7.7 cm, 95%= 24.9 cm for 36 wks)

FETAL BIOMETRY

BPD:  8.8cm 35w 2d

HC:    32.3cm 36w 4d

AC:    31.8cm 35w 5d

FL:    6.9cm 35w 2d

Current Mean GA: 35w 6d US EDC: 10/02/2019

Assigned GA: 36w 2d Assigned EDC: 09/29/2019

Estimated Fetal Weight:  2,730g 35%ile

FETAL ANATOMY

Lateral Ventricles: Appears normal

Thalami/CSP: Appears normal

Posterior Fossa: Previously seen

Nuchal Region: Previously seen

Upper Lip: Previously seen

Spine: Previously seen

4 Chamber Heart on Left: Appears normal

LVOT: Appears normal

RVOT: Appears normal

Stomach on Left: Appears normal

3 Vessel Cord: Previously seen

Cord Insertion site: Previously seen

Kidneys: Appears normal

Bladder: Appears normal

Extremities: Previously seen

Sex: Previously Seen

Maternal Findings:

Cervix:  Not evaluated (>34 wks)
IMPRESSION: Assigned GA currently 36 weeks 2 days. Appropriate fetal growth,
with EFW at 35 %ile.

Amniotic fluid volume within normal limits, with AFI of 15.1 cm.

## 2022-07-09 ENCOUNTER — Ambulatory Visit
Admission: RE | Admit: 2022-07-09 | Discharge: 2022-07-09 | Disposition: A | Discharge status: Home / Self Care | Payer: Medicaid Other | Source: Ambulatory Visit | Attending: Emergency Medicine | Admitting: Emergency Medicine

## 2022-07-09 VITALS — BP 123/78 | HR 102 | Temp 97.8°F | Resp 18

## 2022-07-09 DIAGNOSIS — Z202 Contact with and (suspected) exposure to infections with a predominantly sexual mode of transmission: Secondary | ICD-10-CM | POA: Insufficient documentation

## 2022-07-09 LAB — POCT URINE PREGNANCY: Preg Test, Ur: NEGATIVE

## 2022-07-09 MED ORDER — PENICILLIN G BENZATHINE 1200000 UNIT/2ML IM SUSY
2.4000 10*6.[IU] | PREFILLED_SYRINGE | Freq: Once | INTRAMUSCULAR | Status: AC
  Administered 2022-07-09: 2.4 10*6.[IU] via INTRAMUSCULAR

## 2022-07-09 NOTE — Discharge Instructions (Addendum)
You were treated with a long-acting penicillin today.    Your syphilis and other tests are pending.  Do not have any sexual activity for the next 7 days.    Schedule a follow-up appointment with your PCP or gynecologist.

## 2022-07-09 NOTE — ED Triage Notes (Signed)
Patient to Urgent Care with complaints of STD exposure. Believed exposure to syphilis and requests full screening. Denies any rashes/ symptoms/ denies any urinary symptoms.   Found out about syphilis exposure yesterday. Unsure of last exposure.

## 2022-07-09 NOTE — ED Provider Notes (Signed)
Renaldo Fiddler    CSN: [id number] Arrival date & time: 07/09/22  1318      History   Chief Complaint Chief Complaint  Patient presents with   Exposure to STD    Syphilis.Marland KitchenMarland KitchenI'd like to get a full screening if possible - Entered by patient    HPI Angelica Tyler is a 32 y.o. female.  Patient presents with concern for exposure to syphilis.  She states her boyfriend tested positive for syphilis yesterday.  Her boyfriend is bisexual and has unprotected sex with female and female partners.  She denies symptoms; she denies fever, chills, rash, lesions, vaginal discharge, pelvic pain, abdominal pain, dysuria, or other symptoms.  She states this is the second time her boyfriend has exposed her to syphilis; previous exposure was in 2020; she was treated with PCN; her RPR came back negative.    The history is provided by the patient and medical records.    Past Medical History:  Diagnosis Date   Anxiety    Panic attack     Patient Active Problem List   Diagnosis Date Noted   Labor and delivery indication for care or intervention 10/10/2019   Uterine contractions during pregnancy 10/06/2019    History reviewed. No pertinent surgical history.  OB History     Gravida  2   Para  1   Term  1   Preterm  0   AB  1   Living  1      SAB      IAB      Ectopic      Multiple  0   Live Births  1            Home Medications    Prior to Admission medications   Medication Sig Start Date End Date Taking? Authorizing Provider  acetaminophen (TYLENOL) 325 MG tablet Take 2 tablets (650 mg total) by mouth every 4 (four) hours as needed (for pain scale < 4). 10/12/19   McVey, Prudencio Pair, CNM  benzocaine-Menthol (DERMOPLAST) 20-0.5 % AERO Apply 1 application topically as needed for irritation (perineal discomfort). 10/12/19   McVey, Prudencio Pair, CNM  ferrous sulfate 325 (65 FE) MG tablet Take 1 tablet (325 mg total) by mouth 2 (two) times daily with a meal. 10/12/19   McVey,  Prudencio Pair, CNM  ibuprofen (ADVIL) 600 MG tablet Take 1 tablet (600 mg total) by mouth every 6 (six) hours. 10/12/19   McVey, Prudencio Pair, CNM  Prenatal Vit-Fe Fumarate-FA (PRENATAL MULTIVITAMIN) TABS tablet Take 1 tablet by mouth daily at 12 noon.    [provider]  senna-docusate (SENOKOT-S) 8.6-50 MG tablet Take 2 tablets by mouth daily. 10/12/19   McVey, Prudencio Pair, CNM  witch hazel-glycerin (TUCKS) pad Apply 1 application topically continuous. 10/12/19   McVey, Prudencio Pair, CNM    Family History History reviewed. No pertinent family history.  Social History Social History   Tobacco Use   Smoking status: Former    Packs/day: 0    Types: Cigarettes   Smokeless tobacco: Never  Substance Use Topics   Alcohol use: No   Drug use: Not Currently    Types: Marijuana     Allergies   Patient has no known allergies.   Review of Systems Review of Systems  Constitutional:  Negative for chills and fever.  Gastrointestinal:  Negative for abdominal pain, nausea and vomiting.  Genitourinary:  Negative for dysuria, flank pain, hematuria, pelvic pain and vaginal discharge.  Skin:  Negative for color change and rash.  All other systems reviewed and are negative.    Physical Exam Triage Vital Signs ED Triage Vitals  Enc Vitals Group     BP 07/09/22 1354 123/78     Pulse Rate 07/09/22 1342 (!) 102     Resp 07/09/22 1342 18     Temp 07/09/22 1342 97.8 F (36.6 C)     Temp src --      SpO2 07/09/22 1342 96 %     Weight --      Height --      Head Circumference --      Peak Flow --      Pain Score 07/09/22 1351 0     Pain Loc --      Pain Edu? --      Excl. in GC? --    No data found.  Updated Vital Signs BP 123/78   Pulse (!) 102   Temp 97.8 F (36.6 C)   Resp 18   SpO2 96%   Visual Acuity Right Eye Distance:   Left Eye Distance:   Bilateral Distance:    Right Eye Near:   Left Eye Near:    Bilateral Near:     Physical Exam Vitals and nursing note reviewed.   Constitutional:      General: She is not in acute distress.    Appearance: Normal appearance. She is well-developed. She is not ill-appearing.  HENT:     Mouth/Throat:     Mouth: Mucous membranes are moist.  Cardiovascular:     Rate and Rhythm: Normal rate and regular rhythm.     Heart sounds: Normal heart sounds.  Pulmonary:     Effort: Pulmonary effort is normal. No respiratory distress.     Breath sounds: Normal breath sounds.  Abdominal:     General: Bowel sounds are normal.     Palpations: Abdomen is soft.     Tenderness: There is no abdominal tenderness. There is no right CVA tenderness, left CVA tenderness, guarding or rebound.  Musculoskeletal:     Cervical back: Neck supple.  Skin:    General: Skin is warm and dry.  Neurological:     Mental Status: She is alert.  Psychiatric:        Mood and Affect: Mood normal.        Behavior: Behavior normal.      UC Treatments / Results  Labs (all labs ordered are listed, but only abnormal results are displayed) Labs Reviewed  RPR  HIV ANTIBODY (ROUTINE TESTING W REFLEX)  POCT URINE PREGNANCY  CERVICOVAGINAL ANCILLARY ONLY    EKG   Radiology No results found.  Procedures Procedures (including critical care time)  Medications Ordered in UC Medications  penicillin g benzathine (BICILLIN LA) 1200000 UNIT/2ML injection 2.4 Million Units (2.4 Million Units Intramuscular Given 07/09/22 1424)    Initial Impression / Assessment and Plan / UC Course  I have reviewed the triage vital signs and the nursing notes.  Pertinent labs & imaging results that were available during my care of the patient were reviewed by me and considered in my medical decision making (see chart for details).    Possible exposure to STD, exposure to syphilis.  Treated today with Bicillin LA.  RPR pending.  Also pending are vaginal cytology and HIV.  Discussed that we will call if test results are positive.  Discussed that she may require  additional treatment at that time.  Discussed that sexual partner(s)  may also require treatment.  Instructed patient to abstain from sexual activity for at least 7 days.  Instructed her to follow-up with her PCP or gynecologist.  Patient agrees to plan of care.   Final Clinical Impressions(s) / UC Diagnoses   Final diagnoses:  Possible exposure to STD  Exposure to syphilis     Discharge Instructions      You were treated with a long-acting penicillin today.    Your syphilis and other tests are pending.  Do not have any sexual activity for the next 7 days.    Schedule a follow-up appointment with your PCP or gynecologist.     ED Prescriptions   None    PDMP not reviewed this encounter.   Mickie Bail, NP 07/09/22 [phone]

## 2022-07-11 LAB — CERVICOVAGINAL ANCILLARY ONLY
Bacterial Vaginitis (gardnerella): NEGATIVE
Candida Glabrata: NEGATIVE
Candida Vaginitis: POSITIVE — AB
Chlamydia: NEGATIVE
Comment: NEGATIVE
Comment: NEGATIVE
Comment: NEGATIVE
Comment: NEGATIVE
Comment: NEGATIVE
Comment: NORMAL
Neisseria Gonorrhea: NEGATIVE
Trichomonas: NEGATIVE

## 2022-07-12 LAB — RPR: RPR Ser Ql: NONREACTIVE

## 2022-07-12 LAB — HIV ANTIBODY (ROUTINE TESTING W REFLEX): HIV Screen 4th Generation wRfx: NONREACTIVE

## 2022-07-13 ENCOUNTER — Telehealth (HOSPITAL_COMMUNITY): Payer: Self-pay | Admitting: Emergency Medicine

## 2022-07-13 MED ORDER — FLUCONAZOLE 150 MG PO TABS: 150.0000 mg | ORAL_TABLET | Freq: Once | ORAL | 0 refills | Status: AC

## 2022-07-26 ENCOUNTER — Ambulatory Visit: Payer: Medicaid Other
